# Patient Record
Sex: Female | Born: 1970 | Hispanic: Yes | Marital: Single | Smoking: Never smoker
Health system: Southern US, Community
[De-identification: ages and names within clinical notes are randomized; demographics above are authoritative.]

---

## 2004-03-05 ENCOUNTER — Emergency Department: Payer: Self-pay | Admitting: Emergency Medicine

## 2017-05-11 ENCOUNTER — Emergency Department: Payer: Self-pay

## 2017-05-11 ENCOUNTER — Other Ambulatory Visit: Payer: Self-pay

## 2017-05-11 ENCOUNTER — Emergency Department
Admission: EM | Admit: 2017-05-11 | Discharge: 2017-05-11 | Disposition: A | Discharge status: Home / Self Care | Payer: Self-pay | Attending: Emergency Medicine | Admitting: Emergency Medicine

## 2017-05-11 DIAGNOSIS — M5412 Radiculopathy, cervical region: Secondary | ICD-10-CM | POA: Insufficient documentation

## 2017-05-11 MED ORDER — DEXAMETHASONE SODIUM PHOSPHATE 10 MG/ML IJ SOLN: 20.0000 mg | Freq: Once | INTRAMUSCULAR | Status: DC

## 2017-05-11 MED ORDER — KETOROLAC TROMETHAMINE 30 MG/ML IJ SOLN
30.0000 mg | Freq: Once | INTRAMUSCULAR | Status: AC
  Administered 2017-05-11: 30 mg via INTRAMUSCULAR
  Filled 2017-05-11: qty 1

## 2017-05-11 MED ORDER — CYCLOBENZAPRINE HCL 10 MG PO TABS
10.0000 mg | ORAL_TABLET | Freq: Once | ORAL | Status: AC
  Administered 2017-05-11: 10 mg via ORAL
  Filled 2017-05-11: qty 1

## 2017-05-11 MED ORDER — PREDNISONE 50 MG PO TABS: ORAL_TABLET | ORAL | 0 refills | Status: DC

## 2017-05-11 MED ORDER — METHYLPREDNISOLONE SODIUM SUCC 125 MG IJ SOLR
125.0000 mg | Freq: Once | INTRAMUSCULAR | Status: AC
  Administered 2017-05-11: 125 mg via INTRAMUSCULAR
  Filled 2017-05-11: qty 2

## 2017-05-11 NOTE — ED Triage Notes (Signed)
Pt states upper back pain x 1 week. L upper back pain and upper middle per pt. Been taking tylenol at home. No cardiac hx for pt. No distress noted. Alert, oriented, ambulatory. Able to speak some english.

## 2017-05-11 NOTE — ED Provider Notes (Signed)
[facility] Emergency Department Provider Note  ____________________________________________  Time seen: Approximately 9:54 PM  I have reviewed the triage vital signs and the nursing notes.   HISTORY  Chief Complaint Back Pain    HPI Carolyn Moreno is a 47 y.o. female presents to the emergency department with upper back pain and numbness that radiates into the left upper extremity after patient performs extension at the neck.  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  Patient denies recent trauma or falls.  She denies chest pain, chest tightness, nausea, vomiting abdominal pain.   History reviewed. No pertinent past medical history.  There are no active problems to display for this patient.   Past Surgical History:  Procedure Laterality Date  . CESAREAN SECTION      Prior to Admission medications   Medication Sig Start Date End Date Taking? Authorizing Provider  predniSONE (DELTASONE) 50 MG tablet Take one tablet daily by mouth for the next five days. 05/11/17   Orvil Feil, PA-C    Allergies Patient has no known allergies.  History reviewed. No pertinent family history.  Social History Social History   Tobacco Use  . Smoking status: Never Smoker  Substance Use Topics  . Alcohol use: No    Frequency: Never  . Drug use: Not on file     Review of Systems  Constitutional: No fever/chills Eyes: No visual changes. No discharge ENT: No upper respiratory complaints. Cardiovascular: no chest pain. Respiratory: no cough. No SOB. Musculoskeletal: Patient has upper back pain and radiculopathy of the left upper extremity.  Skin: Negative for rash, abrasions, lacerations, ecchymosis. Neurological: Negative for headaches, focal weakness or numbness.   ____________________________________________   PHYSICAL EXAM:  VITAL SIGNS: ED Triage Vitals [05/11/17 2035]  Enc Vitals Group     BP      Pulse      Resp      Temp      Temp src      SpO2      Weight 182 lb (82.6 kg)     Height 5\' 4"  (1.626 m)     Head Circumference      Peak Flow      Pain Score 10     Pain Loc      Pain Edu?      Excl. in GC?      Constitutional: Alert and oriented. Well appearing and in no acute distress. Eyes: Conjunctivae are normal. PERRL. EOMI. Head: Atraumatic.  Cardiovascular: Normal rate, regular rhythm. Normal S1 and S2.  Good peripheral circulation. Respiratory: Normal respiratory effort without tachypnea or retractions. Lungs CTAB. Good air entry to the bases with no decreased or absent breath sounds. Musculoskeletal: Patient has radiculopathy elicited with range of motion testing at the neck.  No pain was elicited with paraspinal muscle palpation of the thoracic and cervical spine. Neurologic:  Normal speech and language. No gross focal neurologic deficits are appreciated.  Skin:  Skin is warm, dry and intact. No rash noted. Psychiatric: Mood and affect are normal. Speech and behavior are normal. Patient exhibits appropriate insight and judgement.   ____________________________________________   LABS (all labs ordered are listed, but only abnormal results are displayed)  Labs Reviewed - No data to display ____________________________________________  EKG   ____________________________________________  RADIOLOGY Geraldo Pitter, personally viewed and evaluated these images (plain radiographs) as part of my medical decision making, as well as reviewing the written report by  the radiologist.    Dg Cervical Spine 2-3 Views  Result Date: 05/11/2017 CLINICAL DATA:  Neck and upper back pain for 1 week.  No injury. EXAM: CERVICAL SPINE - 2-3 VIEW COMPARISON:  None. FINDINGS: There is no evidence of cervical spine fracture or prevertebral soft tissue swelling. Alignment is normal. No other significant bone abnormalities are identified. IMPRESSION: Normal alignment of the cervical vertebrae.  No acute displaced fractures identified. Electronically Signed   By: Burman NievesWilliam  Stevens M.D.   On: 05/11/2017 22:23   Dg Thoracic Spine 2 View  Result Date: 05/11/2017 CLINICAL DATA:  Neck and upper back pain. Pain for 1 week. No injury. EXAM: THORACIC SPINE 2 VIEWS COMPARISON:  None. FINDINGS: Normal alignment of the thoracic vertebrae. Degenerative changes with narrowed interspaces and endplate hypertrophic changes most prominent in the midthoracic region. No vertebral compression deformities. No focal bone lesion or bone destruction. No paraspinal soft tissue infiltration. IMPRESSION: Degenerative changes in the thoracic spine. Normal alignment. No acute displaced fractures identified Electronically Signed   By: Burman NievesWilliam  Stevens M.D.   On: 05/11/2017 22:22    ____________________________________________    PROCEDURES  Procedure(s) performed:    Procedures    Medications  cyclobenzaprine (FLEXERIL) tablet 10 mg (10 mg Oral Given 05/11/17 2144)  ketorolac (TORADOL) 30 MG/ML injection 30 mg (30 mg Intramuscular Given 05/11/17 2245)  methylPREDNISolone sodium succinate (SOLU-MEDROL) 125 mg/2 mL injection 125 mg (125 mg Intramuscular Given 05/11/17 2245)     ____________________________________________   INITIAL IMPRESSION / ASSESSMENT AND PLAN / ED COURSE  Pertinent labs & imaging results that were available during my care of the patient were reviewed by me and considered in my medical decision making (see chart for details).  Review of the [place] CSRS was performed in accordance of the [facility] prior to dispensing any controlled drugs.     Assessment and Plan: Back pain Patient presents to the emergency department with radiculopathy of the left upper extremity reproduced with range of motion at the neck.  Differential diagnosis included cervical radiculopathy, fracture and muscle spasm.  X-ray examination of the neck revealed no acute abnormality.  Patient was given an injection of  Solu-Medrol and discharged with prednisone.  Patient noted no improvement in her symptoms with Flexeril given in the emergency department.  All patient questions were answered.    ____________________________________________  FINAL CLINICAL IMPRESSION(S) / ED DIAGNOSES  Final diagnoses:  Cervical radiculopathy      NEW MEDICATIONS STARTED DURING THIS VISIT:  ED Discharge Orders        Ordered    predniSONE (DELTASONE) 50 MG tablet     05/11/17 2240          This chart was dictated using voice recognition software/Dragon. Despite best efforts to proofread, errors can occur which can change the meaning. Any change was purely unintentional.    Orvil FeilWoods, Charmion Hapke M, PA-C 05/11/17 2321    Minna AntisPaduchowski, Kevin, MD 05/12/17 2325

## 2020-08-30 ENCOUNTER — Other Ambulatory Visit: Payer: Self-pay

## 2020-08-30 ENCOUNTER — Emergency Department: Payer: Self-pay

## 2020-08-30 ENCOUNTER — Emergency Department
Admission: EM | Admit: 2020-08-30 | Discharge: 2020-08-30 | Disposition: A | Discharge status: Home / Self Care | Payer: Self-pay | Attending: Emergency Medicine | Admitting: Emergency Medicine

## 2020-08-30 ENCOUNTER — Encounter: Payer: Self-pay | Admitting: Emergency Medicine

## 2020-08-30 DIAGNOSIS — M25552 Pain in left hip: Secondary | ICD-10-CM | POA: Insufficient documentation

## 2020-08-30 DIAGNOSIS — G8929 Other chronic pain: Secondary | ICD-10-CM | POA: Insufficient documentation

## 2020-08-30 DIAGNOSIS — M79602 Pain in left arm: Secondary | ICD-10-CM | POA: Insufficient documentation

## 2020-08-30 LAB — CBC
HCT: 37.1 % (ref 36.0–46.0)
Hemoglobin: 12.7 g/dL (ref 12.0–15.0)
MCH: 30.5 pg (ref 26.0–34.0)
MCHC: 34.2 g/dL (ref 30.0–36.0)
MCV: 89.2 fL (ref 80.0–100.0)
Platelets: 271 10*3/uL (ref 150–400)
RBC: 4.16 MIL/uL (ref 3.87–5.11)
RDW: 14.7 % (ref 11.5–15.5)
WBC: 6.5 10*3/uL (ref 4.0–10.5)
nRBC: 0 % (ref 0.0–0.2)

## 2020-08-30 LAB — BASIC METABOLIC PANEL
Anion gap: 8 (ref 5–15)
BUN: 15 mg/dL (ref 6–20)
CO2: 23 mmol/L (ref 22–32)
Calcium: 9 mg/dL (ref 8.9–10.3)
Chloride: 109 mmol/L (ref 98–111)
Creatinine, Ser: 0.58 mg/dL (ref 0.44–1.00)
GFR, Estimated: >60 mL/min (ref >60)
Glucose, Bld: 137 mg/dL — ABNORMAL HIGH (ref 70–99)
Potassium: 3.7 mmol/L (ref 3.5–5.1)
Sodium: 140 mmol/L (ref 135–145)

## 2020-08-30 MED ORDER — PREDNISONE 10 MG (21) PO TBPK: ORAL_TABLET | ORAL | 0 refills | Status: DC

## 2020-08-30 NOTE — ED Provider Notes (Signed)
[facility] Emergency Department Provider Note  ____________________________________________   Event Date/Time   First MD Initiated Contact with Patient 08/30/20 1728     (approximate)  I have reviewed the triage vital signs and the nursing notes.   HISTORY  Chief Complaint Leg Pain    HPI Carolyn Moreno is a 50 y.o. female presents emergency department complaining of left leg and left arm pain for at least 3 months.  States she seen her PCP twice they gave her anti-inflammatories and muscle relaxers without any relief.  No new complaints as far as this is concerned.  Is the continued same complaint from 3 months ago; states her leg will go cold sometimes, pain without arm only with movement.    History reviewed. No pertinent past medical history.  There are no problems to display for this patient.   Past Surgical History:  Procedure Laterality Date  . CESAREAN SECTION      Prior to Admission medications   Medication Sig Start Date End Date Taking? Authorizing Provider  predniSONE (STERAPRED UNI-PAK 21 TAB) 10 MG (21) TBPK tablet Take 6 pills on day one then decrease by 1 pill each day 08/30/20  Yes Sherrie Mustache Roselyn Bering, PA-C    Allergies Patient has no known allergies.  History reviewed. No pertinent family history.  Social History Social History   Tobacco Use  . Smoking status: Never Smoker  . Smokeless tobacco: Never Used  Substance Use Topics  . Alcohol use: No    Review of Systems  Constitutional: No fever/chills Eyes: No visual changes. ENT: No sore throat. Respiratory: Denies cough Cardiovascular: Denies chest pain Gastrointestinal: Denies abdominal pain Genitourinary: Negative for dysuria. Musculoskeletal: Negative for back pain.  Positive leg pain and arm pain Skin: Negative for rash. Psychiatric: no mood changes,     ____________________________________________   PHYSICAL EXAM:  VITAL SIGNS: ED Triage Vitals   Enc Vitals Group     BP 08/30/20 1655 (!) 137/111     Pulse Rate 08/30/20 1655 100     Resp 08/30/20 1655 20     Temp 08/30/20 1655 98.4 F (36.9 C)     Temp Source 08/30/20 1655 Oral     SpO2 08/30/20 1655 100 %     Weight 08/30/20 1656 197 lb (89.4 kg)     Height 08/30/20 1656 5\' 4"  (1.626 m)     Head Circumference --      Peak Flow --      Pain Score 08/30/20 1656 8     Pain Loc --      Pain Edu? --      Excl. in GC? --     Constitutional: Alert and oriented. Well appearing and in no acute distress. Eyes: Conjunctivae are normal.  Head: Atraumatic. Nose: No congestion/rhinnorhea. Mouth/Throat: Mucous membranes are moist.   Neck:  supple no lymphadenopathy noted Cardiovascular: Normal rate, regular rhythm.  Respiratory: Normal respiratory effort.  No retractions, GU: deferred Musculoskeletal: FROM all extremities, warm and well perfused Neurologic:  Normal speech and language.  Skin:  Skin is warm, dry and intact. No rash noted. Psychiatric: Mood and affect are normal. Speech and behavior are normal.  ____________________________________________   LABS (all labs ordered are listed, but only abnormal results are displayed)  Labs Reviewed  BASIC METABOLIC PANEL - Abnormal; Notable for the following components:      Result Value   Glucose, Bld 137 (*)    All other components within normal limits  CBC  ____________________________________________   ____________________________________________  RADIOLOGY  Ultrasound left lower extremity  ____________________________________________   PROCEDURES  Procedure(s) performed: No  Procedures    ____________________________________________   INITIAL IMPRESSION / ASSESSMENT AND PLAN / ED COURSE  Pertinent labs & imaging results that were available during my care of the patient were reviewed by me and considered in my medical decision making (see chart for details).   Patient is 50 year old female presents  with chronic complaints of left leg and left arm pain.  See HPI.  Physical exam is unremarkable  DDx: DVT, radiculopathy cervical and lumbar, arthritis, inflammatory process  Labs are reassuring, CBC and metabolic panel are normal  Did explain the findings to the patient, encouraged her to follow-up with orthopedics.  Explained her this is a chronic problem not acute.  For the emergency department purposes, she does not have any acute findings that need to be further examined.  She was given a prescription for a steroid pack.  Discharged in stable condition.     Carolyn Moreno was evaluated in Emergency Department on 08/30/2020 for the symptoms described in the history of present illness. She was evaluated in the context of the global COVID-19 pandemic, which necessitated consideration that the patient might be at risk for infection with the SARS-CoV-2 virus that causes COVID-19. Institutional protocols and algorithms that pertain to the evaluation of patients at risk for COVID-19 are in a state of rapid change based on information released by regulatory bodies including the CDC and federal and state organizations. These policies and algorithms were followed during the patient's care in the ED.    As part of my medical decision making, I reviewed the following data within the electronic MEDICAL RECORD NUMBER Nursing notes reviewed and incorporated, Labs reviewed , Old chart reviewed, Radiograph reviewed , Notes from prior ED visits and [place] Controlled Substance Database  ____________________________________________   FINAL CLINICAL IMPRESSION(S) / ED DIAGNOSES  Final diagnoses:  Chronic hip pain, left  Left arm pain      NEW MEDICATIONS STARTED DURING THIS VISIT:  New Prescriptions   PREDNISONE (STERAPRED UNI-PAK 21 TAB) 10 MG (21) TBPK TABLET    Take 6 pills on day one then decrease by 1 pill each day     Note:  This document was prepared using Dragon voice recognition software and may  include unintentional dictation errors.    Faythe Ghee, PA-C 08/30/20 1818    Jene Every, MD 08/30/20 [phone]

## 2020-08-30 NOTE — ED Triage Notes (Signed)
Pt presents to the ED for L leg and L arm. Pt states that the past couple of week she has been having pain that started in her L groin that travelled down her leg. Pt states that she believe that it is "hot and burning inside." Pt also states that last night her L foot was swollen. Pt was seen recently by her PCP and they prescribed her pain med and anti inflammatory meds without relief.

## 2020-08-30 NOTE — ED Notes (Signed)
Lavender, light green and blue top sent to lab.   

## 2020-08-30 NOTE — Discharge Instructions (Addendum)
Follow-up with your regular doctor if not improving in 2 to 3 days.  Return emergency department if worsening.  Use medication as prescribed Call and make an appointment with orthopedics as this is a chronic problem and not acute.

## 2021-07-23 ENCOUNTER — Encounter: Payer: Self-pay | Admitting: Emergency Medicine

## 2021-07-23 ENCOUNTER — Observation Stay
Admission: EM | Admit: 2021-07-23 | Discharge: 2021-07-24 | Disposition: A | Discharge status: Home / Self Care | Payer: Self-pay | Attending: General Surgery | Admitting: General Surgery

## 2021-07-23 ENCOUNTER — Observation Stay: Payer: Self-pay | Admitting: Certified Registered"

## 2021-07-23 ENCOUNTER — Emergency Department: Payer: Self-pay

## 2021-07-23 ENCOUNTER — Encounter
Admission: EM | Disposition: A | Discharge status: Home / Self Care | Payer: Self-pay | Source: Home / Self Care | Attending: Student in an Organized Health Care Education/Training Program

## 2021-07-23 ENCOUNTER — Other Ambulatory Visit: Payer: Self-pay

## 2021-07-23 DIAGNOSIS — K3580 Unspecified acute appendicitis: Secondary | ICD-10-CM

## 2021-07-23 DIAGNOSIS — K353 Acute appendicitis with localized peritonitis, without perforation or gangrene: Principal | ICD-10-CM | POA: Insufficient documentation

## 2021-07-23 HISTORY — PX: XI ROBOTIC LAPAROSCOPIC ASSISTED APPENDECTOMY: SHX6877

## 2021-07-23 LAB — COMPREHENSIVE METABOLIC PANEL
ALT: 23 U/L (ref 0–44)
AST: 20 U/L (ref 15–41)
Albumin: 4 g/dL (ref 3.5–5.0)
Alkaline Phosphatase: 69 U/L (ref 38–126)
Anion gap: 9 (ref 5–15)
BUN: 15 mg/dL (ref 6–20)
CO2: 26 mmol/L (ref 22–32)
Calcium: 9.2 mg/dL (ref 8.9–10.3)
Chloride: 104 mmol/L (ref 98–111)
Creatinine, Ser: 0.6 mg/dL (ref 0.44–1.00)
GFR, Estimated: >60 mL/min (ref >60)
Glucose, Bld: 104 mg/dL — ABNORMAL HIGH (ref 70–99)
Potassium: 3.6 mmol/L (ref 3.5–5.1)
Sodium: 139 mmol/L (ref 135–145)
Total Bilirubin: 0.7 mg/dL (ref 0.3–1.2)
Total Protein: 7.6 g/dL (ref 6.5–8.1)

## 2021-07-23 LAB — URINALYSIS, ROUTINE W REFLEX MICROSCOPIC
Bilirubin Urine: NEGATIVE
Glucose, UA: NEGATIVE mg/dL
Hgb urine dipstick: NEGATIVE
Ketones, ur: NEGATIVE mg/dL
Leukocytes,Ua: NEGATIVE
Nitrite: NEGATIVE
Protein, ur: NEGATIVE mg/dL
Specific Gravity, Urine: 1.006 (ref 1.005–1.030)
pH: 7 (ref 5.0–8.0)

## 2021-07-23 LAB — CBC
HCT: 38.6 % (ref 36.0–46.0)
Hemoglobin: 12.6 g/dL (ref 12.0–15.0)
MCH: 29.4 pg (ref 26.0–34.0)
MCHC: 32.6 g/dL (ref 30.0–36.0)
MCV: 90 fL (ref 80.0–100.0)
Platelets: 304 10*3/uL (ref 150–400)
RBC: 4.29 MIL/uL (ref 3.87–5.11)
RDW: 14.3 % (ref 11.5–15.5)
WBC: 10.9 10*3/uL — ABNORMAL HIGH (ref 4.0–10.5)
nRBC: 0 % (ref 0.0–0.2)

## 2021-07-23 LAB — POC URINE PREG, ED: Preg Test, Ur: NEGATIVE

## 2021-07-23 LAB — TROPONIN I (HIGH SENSITIVITY): Troponin I (High Sensitivity): 4 ng/L (ref <18)

## 2021-07-23 LAB — LIPASE, BLOOD: Lipase: 24 U/L (ref 11–51)

## 2021-07-23 SURGERY — APPENDECTOMY, ROBOT-ASSISTED, LAPAROSCOPIC
Anesthesia: General

## 2021-07-23 MED ORDER — MORPHINE SULFATE (PF) 4 MG/ML IV SOLN
4.0000 mg | INTRAVENOUS | Status: DC | PRN
  Administered 2021-07-23: 4 mg via INTRAVENOUS
  Filled 2021-07-23: qty 1

## 2021-07-23 MED ORDER — EPHEDRINE SULFATE (PRESSORS) 50 MG/ML IJ SOLN
INTRAMUSCULAR | Status: DC | PRN
  Administered 2021-07-23: 10 mg via INTRAVENOUS
  Administered 2021-07-23: 5 mg via INTRAVENOUS

## 2021-07-23 MED ORDER — SODIUM CHLORIDE 0.9 % IV BOLUS
500.0000 mL | Freq: Once | INTRAVENOUS | Status: AC
  Administered 2021-07-23: 500 mL via INTRAVENOUS

## 2021-07-23 MED ORDER — LIDOCAINE VISCOUS HCL 2 % MT SOLN
15.0000 mL | Freq: Once | OROMUCOSAL | Status: AC
  Administered 2021-07-23: 15 mL via ORAL
  Filled 2021-07-23: qty 15

## 2021-07-23 MED ORDER — HYDROCODONE-ACETAMINOPHEN 5-325 MG PO TABS
1.0000 | ORAL_TABLET | ORAL | Status: DC | PRN
  Administered 2021-07-24 (×2): 2 via ORAL
  Filled 2021-07-23 (×2): qty 2

## 2021-07-23 MED ORDER — IOHEXOL 300 MG/ML  SOLN
100.0000 mL | Freq: Once | INTRAMUSCULAR | Status: AC | PRN
Start: 2021-07-23 — End: 2021-07-23
  Administered 2021-07-23: 100 mL via INTRAVENOUS

## 2021-07-23 MED ORDER — PIPERACILLIN-TAZOBACTAM 3.375 G IVPB
3.3750 g | Freq: Three times a day (TID) | INTRAVENOUS | Status: DC
  Administered 2021-07-24: 3.375 g via INTRAVENOUS
  Filled 2021-07-23: qty 50

## 2021-07-23 MED ORDER — BUPIVACAINE-EPINEPHRINE (PF) 0.5% -1:200000 IJ SOLN
INTRAMUSCULAR | Status: AC
  Filled 2021-07-23: qty 30

## 2021-07-23 MED ORDER — PIPERACILLIN-TAZOBACTAM 3.375 G IVPB 30 MIN
3.3750 g | Freq: Once | INTRAVENOUS | Status: AC
  Administered 2021-07-23: 3.375 g via INTRAVENOUS
  Filled 2021-07-23: qty 50

## 2021-07-23 MED ORDER — ONDANSETRON 4 MG PO TBDP: 4.0000 mg | ORAL_TABLET | Freq: Four times a day (QID) | ORAL | Status: DC | PRN

## 2021-07-23 MED ORDER — LIDOCAINE HCL (PF) 2 % IJ SOLN
INTRAMUSCULAR | Status: AC
  Filled 2021-07-23: qty 5

## 2021-07-23 MED ORDER — ENOXAPARIN SODIUM 40 MG/0.4ML IJ SOSY
40.0000 mg | PREFILLED_SYRINGE | INTRAMUSCULAR | Status: DC
  Administered 2021-07-23: 40 mg via SUBCUTANEOUS
  Filled 2021-07-23: qty 0.4

## 2021-07-23 MED ORDER — PHENYLEPHRINE 40 MCG/ML (10ML) SYRINGE FOR IV PUSH (FOR BLOOD PRESSURE SUPPORT)
PREFILLED_SYRINGE | INTRAVENOUS | Status: AC
Start: 2021-07-23 — End: ?
  Filled 2021-07-23: qty 10

## 2021-07-23 MED ORDER — DEXAMETHASONE SODIUM PHOSPHATE 10 MG/ML IJ SOLN
INTRAMUSCULAR | Status: DC | PRN
  Administered 2021-07-23: 10 mg via INTRAVENOUS

## 2021-07-23 MED ORDER — ONDANSETRON HCL 4 MG/2ML IJ SOLN
INTRAMUSCULAR | Status: AC
  Filled 2021-07-23: qty 2

## 2021-07-23 MED ORDER — ACETAMINOPHEN 325 MG PO TABS: 650.0000 mg | ORAL_TABLET | Freq: Four times a day (QID) | ORAL | Status: DC | PRN

## 2021-07-23 MED ORDER — PHENYLEPHRINE HCL (PRESSORS) 10 MG/ML IV SOLN
INTRAVENOUS | Status: DC | PRN
  Administered 2021-07-23: 80 ug via INTRAVENOUS
  Administered 2021-07-23 (×2): 40 ug via INTRAVENOUS

## 2021-07-23 MED ORDER — DEXMEDETOMIDINE HCL IN NACL 80 MCG/20ML IV SOLN
INTRAVENOUS | Status: AC
Start: 2021-07-23 — End: ?
  Filled 2021-07-23: qty 20

## 2021-07-23 MED ORDER — 0.9 % SODIUM CHLORIDE (POUR BTL) OPTIME
TOPICAL | Status: DC | PRN
  Administered 2021-07-23: 500 mL

## 2021-07-23 MED ORDER — EPHEDRINE 5 MG/ML INJ
INTRAVENOUS | Status: AC
  Filled 2021-07-23: qty 5

## 2021-07-23 MED ORDER — PROPOFOL 10 MG/ML IV BOLUS
INTRAVENOUS | Status: DC | PRN
  Administered 2021-07-23: 150 mg via INTRAVENOUS

## 2021-07-23 MED ORDER — SODIUM CHLORIDE 0.9 % IV SOLN: Freq: Once | INTRAVENOUS | Status: DC

## 2021-07-23 MED ORDER — LACTATED RINGERS IV SOLN: INTRAVENOUS | Status: DC | PRN

## 2021-07-23 MED ORDER — DEXMEDETOMIDINE (PRECEDEX) IN NS 20 MCG/5ML (4 MCG/ML) IV SYRINGE
PREFILLED_SYRINGE | INTRAVENOUS | Status: DC | PRN
  Administered 2021-07-23: 4 ug via INTRAVENOUS

## 2021-07-23 MED ORDER — FENTANYL CITRATE (PF) 100 MCG/2ML IJ SOLN
INTRAMUSCULAR | Status: AC
  Filled 2021-07-23: qty 2

## 2021-07-23 MED ORDER — ROCURONIUM BROMIDE 100 MG/10ML IV SOLN
INTRAVENOUS | Status: DC | PRN
Start: 2021-07-23 — End: 2021-07-23
  Administered 2021-07-23: 30 mg via INTRAVENOUS

## 2021-07-23 MED ORDER — SEVOFLURANE IN SOLN
RESPIRATORY_TRACT | Status: AC
  Filled 2021-07-23: qty 250

## 2021-07-23 MED ORDER — ONDANSETRON HCL 4 MG/2ML IJ SOLN: 4.0000 mg | Freq: Four times a day (QID) | INTRAMUSCULAR | Status: DC | PRN

## 2021-07-23 MED ORDER — PROPOFOL 10 MG/ML IV BOLUS
INTRAVENOUS | Status: AC
  Filled 2021-07-23: qty 20

## 2021-07-23 MED ORDER — ALUM & MAG HYDROXIDE-SIMETH 200-200-20 MG/5ML PO SUSP
30.0000 mL | Freq: Once | ORAL | Status: AC
  Administered 2021-07-23: 30 mL via ORAL
  Filled 2021-07-23: qty 30

## 2021-07-23 MED ORDER — FENTANYL CITRATE (PF) 100 MCG/2ML IJ SOLN
INTRAMUSCULAR | Status: DC | PRN
  Administered 2021-07-23: 50 ug via INTRAVENOUS
  Administered 2021-07-23: 100 ug via INTRAVENOUS

## 2021-07-23 MED ORDER — ACETAMINOPHEN 10 MG/ML IV SOLN
INTRAVENOUS | Status: DC | PRN
  Administered 2021-07-23: 1000 mg via INTRAVENOUS

## 2021-07-23 MED ORDER — SUGAMMADEX SODIUM 200 MG/2ML IV SOLN
INTRAVENOUS | Status: DC | PRN
  Administered 2021-07-23: 200 mg via INTRAVENOUS

## 2021-07-23 MED ORDER — DEXAMETHASONE SODIUM PHOSPHATE 10 MG/ML IJ SOLN
INTRAMUSCULAR | Status: AC
  Filled 2021-07-23: qty 1

## 2021-07-23 MED ORDER — KETOROLAC TROMETHAMINE 30 MG/ML IJ SOLN
INTRAMUSCULAR | Status: AC
  Filled 2021-07-23: qty 1

## 2021-07-23 MED ORDER — ONDANSETRON 4 MG PO TBDP
4.0000 mg | ORAL_TABLET | Freq: Once | ORAL | Status: AC | PRN
  Administered 2021-07-23: 4 mg via ORAL
  Filled 2021-07-23: qty 1

## 2021-07-23 MED ORDER — FENTANYL CITRATE (PF) 100 MCG/2ML IJ SOLN
INTRAMUSCULAR | Status: AC
Start: 2021-07-23 — End: ?
  Filled 2021-07-23: qty 2

## 2021-07-23 MED ORDER — SUCCINYLCHOLINE CHLORIDE 200 MG/10ML IV SOSY
PREFILLED_SYRINGE | INTRAVENOUS | Status: DC | PRN
  Administered 2021-07-23: 100 mg via INTRAVENOUS

## 2021-07-23 MED ORDER — ONDANSETRON HCL 4 MG/2ML IJ SOLN: 4.0000 mg | Freq: Once | INTRAMUSCULAR | Status: DC | PRN

## 2021-07-23 MED ORDER — SUCCINYLCHOLINE CHLORIDE 200 MG/10ML IV SOSY
PREFILLED_SYRINGE | INTRAVENOUS | Status: AC
  Filled 2021-07-23: qty 10

## 2021-07-23 MED ORDER — MIDAZOLAM HCL 2 MG/2ML IJ SOLN
INTRAMUSCULAR | Status: DC | PRN
  Administered 2021-07-23: 2 mg via INTRAVENOUS

## 2021-07-23 MED ORDER — ROCURONIUM BROMIDE 10 MG/ML (PF) SYRINGE
PREFILLED_SYRINGE | INTRAVENOUS | Status: AC
  Filled 2021-07-23: qty 10

## 2021-07-23 MED ORDER — OXYCODONE HCL 5 MG PO TABS
5.0000 mg | ORAL_TABLET | Freq: Once | ORAL | Status: AC | PRN
  Administered 2021-07-23: 5 mg via ORAL

## 2021-07-23 MED ORDER — ACETAMINOPHEN 650 MG RE SUPP: 650.0000 mg | Freq: Four times a day (QID) | RECTAL | Status: DC | PRN

## 2021-07-23 MED ORDER — BUPIVACAINE-EPINEPHRINE (PF) 0.5% -1:200000 IJ SOLN
INTRAMUSCULAR | Status: DC | PRN
  Administered 2021-07-23: 30 mL via PERINEURAL

## 2021-07-23 MED ORDER — SODIUM CHLORIDE 0.9 % IV SOLN: INTRAVENOUS | Status: DC

## 2021-07-23 MED ORDER — FENTANYL CITRATE (PF) 100 MCG/2ML IJ SOLN
25.0000 ug | INTRAMUSCULAR | Status: DC | PRN
  Administered 2021-07-23 (×4): 25 ug via INTRAVENOUS

## 2021-07-23 MED ORDER — OXYCODONE HCL 5 MG/5ML PO SOLN: 5.0000 mg | Freq: Once | ORAL | Status: AC | PRN

## 2021-07-23 MED ORDER — MIDAZOLAM HCL 2 MG/2ML IJ SOLN
INTRAMUSCULAR | Status: AC
  Filled 2021-07-23: qty 2

## 2021-07-23 MED ORDER — KETOROLAC TROMETHAMINE 30 MG/ML IJ SOLN
INTRAMUSCULAR | Status: DC | PRN
  Administered 2021-07-23: 30 mg via INTRAVENOUS

## 2021-07-23 MED ORDER — ONDANSETRON HCL 4 MG/2ML IJ SOLN
INTRAMUSCULAR | Status: DC | PRN
  Administered 2021-07-23: 4 mg via INTRAVENOUS

## 2021-07-23 MED ORDER — LIDOCAINE HCL (CARDIAC) PF 100 MG/5ML IV SOSY
PREFILLED_SYRINGE | INTRAVENOUS | Status: DC | PRN
Start: 2021-07-23 — End: 2021-07-23
  Administered 2021-07-23: 60 mg via INTRAVENOUS

## 2021-07-23 MED ORDER — OXYCODONE HCL 5 MG PO TABS
ORAL_TABLET | ORAL | Status: AC
  Filled 2021-07-23: qty 1

## 2021-07-23 SURGICAL SUPPLY — 55 items
BLADE SURG SZ11 CARB STEEL (BLADE) ×2 IMPLANT
CANNULA REDUC XI 12-8 STAPL (CANNULA) ×1
CANNULA REDUCER 12-8 DVNC XI (CANNULA) ×1 IMPLANT
COVER TIP SHEARS 8 DVNC (MISCELLANEOUS) ×1 IMPLANT
COVER TIP SHEARS 8MM DA VINCI (MISCELLANEOUS) ×1
DERMABOND ADVANCED (GAUZE/BANDAGES/DRESSINGS) ×1
DERMABOND ADVANCED .7 DNX12 (GAUZE/BANDAGES/DRESSINGS) ×1 IMPLANT
DRAPE ARM DVNC X/XI (DISPOSABLE) ×4 IMPLANT
DRAPE COLUMN DVNC XI (DISPOSABLE) ×1 IMPLANT
DRAPE DA VINCI XI ARM (DISPOSABLE) ×4
DRAPE DA VINCI XI COLUMN (DISPOSABLE) ×1
ELECT REM PT RETURN 9FT ADLT (ELECTROSURGICAL) ×2
ELECTRODE REM PT RTRN 9FT ADLT (ELECTROSURGICAL) ×1 IMPLANT
GLOVE SURG SYN 6.5 ES PF (GLOVE) ×4 IMPLANT
GLOVE SURG SYN 6.5 PF PI (GLOVE) ×2 IMPLANT
GLOVE SURG UNDER POLY LF SZ6.5 (GLOVE) ×4 IMPLANT
GOWN STRL REUS W/ TWL LRG LVL3 (GOWN DISPOSABLE) ×3 IMPLANT
GOWN STRL REUS W/TWL LRG LVL3 (GOWN DISPOSABLE) ×3
GRASPER SUT TROCAR 14GX15 (MISCELLANEOUS) ×1 IMPLANT
KIT PINK PAD W/HEAD ARE REST (MISCELLANEOUS) ×2 IMPLANT
KIT PINK PAD W/HEAD ARM REST (MISCELLANEOUS) ×1 IMPLANT
MANIFOLD NEPTUNE II (INSTRUMENTS) ×2 IMPLANT
NDL INSUFFLATION 14GA 120MM (NEEDLE) ×1 IMPLANT
NEEDLE HYPO 22GX1.5 SAFETY (NEEDLE) ×2 IMPLANT
NEEDLE INSUFFLATION 14GA 120MM (NEEDLE) ×2 IMPLANT
OBTURATOR OPTICAL STANDARD 8MM (TROCAR) ×1
OBTURATOR OPTICAL STND 8 DVNC (TROCAR) ×1
OBTURATOR OPTICALSTD 8 DVNC (TROCAR) ×1 IMPLANT
PACK LAP CHOLECYSTECTOMY (MISCELLANEOUS) ×2 IMPLANT
RELOAD STAPLE 45 2.5 WHT DVNC (STAPLE) IMPLANT
RELOAD STAPLE 45 3.5 BLU DVNC (STAPLE) ×1 IMPLANT
RELOAD STAPLER 2.5X45 WHT DVNC (STAPLE) IMPLANT
RELOAD STAPLER 3.5X45 BLU DVNC (STAPLE) ×1 IMPLANT
SEAL CANN UNIV 5-8 DVNC XI (MISCELLANEOUS) ×3 IMPLANT
SEAL XI 5MM-8MM UNIVERSAL (MISCELLANEOUS) ×3
SET TUBE SMOKE EVAC HIGH FLOW (TUBING) ×2 IMPLANT
SOLUTION ELECTROLUBE (MISCELLANEOUS) ×2 IMPLANT
SPONGE T-LAP 4X18 ~~LOC~~+RFID (SPONGE) ×2 IMPLANT
STAPLER 45 DA VINCI SURE FORM (STAPLE) ×1
STAPLER 45 SUREFORM DVNC (STAPLE) ×1 IMPLANT
STAPLER CANNULA SEAL DVNC XI (STAPLE) ×1 IMPLANT
STAPLER CANNULA SEAL XI (STAPLE) ×1
STAPLER RELOAD 2.5X45 WHITE (STAPLE)
STAPLER RELOAD 2.5X45 WHT DVNC (STAPLE)
STAPLER RELOAD 3.5X45 BLU DVNC (STAPLE) ×1
STAPLER RELOAD 3.5X45 BLUE (STAPLE) ×1
SUT MNCRL AB 4-0 PS2 18 (SUTURE) ×2 IMPLANT
SUT VIC AB 2-0 SH 27 (SUTURE) ×1
SUT VIC AB 2-0 SH 27XBRD (SUTURE) ×1 IMPLANT
SUT VICRYL 0 AB UR-6 (SUTURE) ×2 IMPLANT
SUT VLOC 90 6 CV-15 VIOLET (SUTURE) ×2 IMPLANT
SYR 30ML LL (SYRINGE) ×2 IMPLANT
SYS BAG RETRIEVAL 10MM (BASKET) ×2
SYSTEM BAG RETRIEVAL 10MM (BASKET) ×1 IMPLANT
WATER STERILE IRR 500ML POUR (IV SOLUTION) ×2 IMPLANT

## 2021-07-23 NOTE — H&P (Signed)
SURGICAL CONSULTATION NOTE  ? ?HISTORY OF PRESENT ILLNESS (HPI):  ?51 y.o. female presented to [facility] ED for evaluation of abdominal pain since 2 days ago. Patient reports patient reported the pain started in the mid abdomen and left abdomen but now is localized to the right lower quadrant.  Pain is aggravated by applying pressure.  There has been no alleviating factors.  Patient tried Tylenol and ibuprofen for pain without relief.  She denies any nausea or vomiting.  She denies any fever. ? ?At the ED she was found with right lower quadrant tenderness.  Labs shows mild leukocytosis.  CT scan of the abdomen and pelvis shows acute appendicitis without perforation.  I personally evaluated the images. ? ?Surgery is consulted by Dr. Roxan Hockey in this context for evaluation and management of acute cholecystitis. ? ?PAST MEDICAL HISTORY (PMH):  ?History reviewed. No pertinent past medical history.  ? ?PAST SURGICAL HISTORY (PSH):  ?Past Surgical History:  ?Procedure Laterality Date  ? CESAREAN SECTION    ?  ? ?MEDICATIONS:  ?Prior to Admission medications   ?Not on File  ?  ? ?ALLERGIES:  ?No Known Allergies  ? ?SOCIAL HISTORY:  ?Social History  ? ?Socioeconomic History  ? Marital status: Single  ?  Spouse name: Not on file  ? Number of children: Not on file  ? Years of education: Not on file  ? Highest education level: Not on file  ?Occupational History  ? Not on file  ?Tobacco Use  ? Smoking status: Never  ? Smokeless tobacco: Never  ?Vaping Use  ? Vaping Use: Never used  ?Substance and Sexual Activity  ? Alcohol use: No  ? Drug use: Never  ? Sexual activity: Not on file  ?Other Topics Concern  ? Not on file  ?Social History Narrative  ? Not on file  ? ?Social Determinants of Health  ? ?Financial Resource Strain: Not on file  ?Food Insecurity: Not on file  ?Transportation Needs: Not on file  ?Physical Activity: Not on file  ?Stress: Not on file  ?Social Connections: Not on file  ?Intimate Partner Violence: Not on file  ?   ? ? ?FAMILY HISTORY:  ?No family history on file.  ? ?REVIEW OF SYSTEMS:  ?Constitutional: denies weight loss, fever, chills, or sweats  ?Eyes: denies any other vision changes, history of eye injury  ?ENT: denies sore throat, hearing problems  ?Respiratory: denies shortness of breath, wheezing  ?Cardiovascular: denies chest pain, palpitations  ?Gastrointestinal: positive abdominal pain, negative nausea and vomiting ?Genitourinary: denies burning with urination or urinary frequency ?Musculoskeletal: denies any other joint pains or cramps  ?Skin: denies any other rashes or skin discolorations  ?Neurological: denies any other headache, dizziness, weakness  ?Psychiatric: denies any other depression, anxiety  ? ?All other review of systems were negative  ? ?VITAL SIGNS:  ?Temp:  [99.4 ?F (37.4 ?C)] 99.4 ?F (37.4 ?C) (04/07 1525) ?Pulse Rate:  [84-95] 85 (04/07 1800) ?Resp:  [16-18] 16 (04/07 1800) ?BP: (137-164)/(78-95) 141/81 (04/07 1800) ?SpO2:  [96 %-97 %] 97 % (04/07 1800) ?Weight:  [85.7 kg] 85.7 kg (04/07 1531)     Height: 5\' 3"  (160 cm) Weight: 85.7 kg BMI (Calculated): 33.49  ? ?INTAKE/OUTPUT:  ?This shift: No intake/output data recorded.  ?Last 2 shifts: @IOLAST2SHIFTS @  ? ?PHYSICAL EXAM:  ?Constitutional:  ?-- Normal body habitus  ?-- Awake, alert, and oriented x3  ?Eyes:  ?-- Pupils equally round and reactive to light  ?-- No scleral icterus  ?Ear, nose,  and throat:  ?-- No jugular venous distension  ?Pulmonary:  ?-- No crackles  ?-- Equal breath sounds bilaterally ?-- Breathing non-labored at rest ?Cardiovascular:  ?-- S1, S2 present  ?-- No pericardial rubs ?Gastrointestinal:  ?-- Abdomen soft, tender in the right upper quadrant, non-distended, no guarding or rebound tenderness ?-- No abdominal masses appreciated, pulsatile or otherwise  ?Musculoskeletal and Integumentary:  ?-- Wounds or skin discoloration: None appreciated ?-- Extremities: B/L UE and LE FROM, hands and feet warm, no edema  ?Neurologic:   ?-- Motor function: intact and symmetric ?-- Sensation: intact and symmetric ? ? ?Labs:  ? ?  Latest Ref Rng & Units 07/23/2021  ?  3:26 PM 08/30/2020  ?  4:57 PM  ?CBC  ?WBC 4.0 - 10.5 K/uL 10.9   6.5    ?Hemoglobin 12.0 - 15.0 g/dL 48.0   16.5    ?Hematocrit 36.0 - 46.0 % 38.6   37.1    ?Platelets 150 - 400 K/uL 304   271    ? ? ?  Latest Ref Rng & Units 07/23/2021  ?  3:26 PM 08/30/2020  ?  4:57 PM  ?CMP  ?Glucose 70 - 99 mg/dL 537   482    ?BUN 6 - 20 mg/dL 15   15    ?Creatinine 0.44 - 1.00 mg/dL 7.07   8.67    ?Sodium 135 - 145 mmol/L 139   140    ?Potassium 3.5 - 5.1 mmol/L 3.6   3.7    ?Chloride 98 - 111 mmol/L 104   109    ?CO2 22 - 32 mmol/L 26   23    ?Calcium 8.9 - 10.3 mg/dL 9.2   9.0    ?Total Protein 6.5 - 8.1 g/dL 7.6     ?Total Bilirubin 0.3 - 1.2 mg/dL 0.7     ?Alkaline Phos 38 - 126 U/L 69     ?AST 15 - 41 U/L 20     ?ALT 0 - 44 U/L 23     ? ? ?Imaging studies:  ?EXAM: ?CT ABDOMEN AND PELVIS WITH CONTRAST ?  ?TECHNIQUE: ?Multidetector CT imaging of the abdomen and pelvis was performed ?using the standard protocol following bolus administration of ?intravenous contrast. ?  ?RADIATION DOSE REDUCTION: This exam was performed according to the ?departmental dose-optimization program which includes automated ?exposure control, adjustment of the mA and/or kV according to ?patient size and/or use of iterative reconstruction technique. ?  ?CONTRAST:  OMNIPAQUE IOHEXOL 300 MG/ML  SOLN ?  ?COMPARISON:  None. ?  ?FINDINGS: ?Lower chest: The lung bases are clear without focal nodule, mass, or ?airspace disease. Heart size is normal. No significant pleural or ?pericardial effusion is present. ?  ?Hepatobiliary: No focal liver abnormality is seen. No gallstones, ?gallbladder wall thickening, or biliary dilatation. ?  ?Pancreas: Unremarkable. No pancreatic ductal dilatation or ?surrounding inflammatory changes. ?  ?Spleen: Normal in size without focal abnormality. ?  ?Adrenals/Urinary Tract: Adrenal glands are  within normal limits ?bilaterally. Kidneys and ureters are unremarkable. Urinary bladder ?is within normal limits. ?  ?Stomach/Bowel: Stomach and duodenum are within normal limits. Small ?bowel is unremarkable. Terminal ileum is within normal limits. ?Appendicolith no is present. The appendix is dilated up to 8 mm a ?mild stranding. The ascending and transverse colon are normal. ?Transverse and descending colon are unremarkable. ?  ?Vascular/Lymphatic: No significant vascular findings are present. No ?enlarged abdominal or pelvic lymph nodes. ?  ?Reproductive: Nabothian cysts are present. Mild edema is  present in ?the uterus. Adnexa are unremarkable. ?  ?Other: No abdominal wall hernia or abnormality. No abdominopelvic ?ascites. ?  ?Musculoskeletal: Vacuum disc present at L5-S1 with central and ?foraminal narrowing. Vertebral body heights are maintained. No focal ?osseous lesions are present. Bony pelvis is within normal limits. ?Hips are unremarkable. ?  ?IMPRESSION: ?1. Acute appendicitis without evidence for rupture or abscess. ?2. Mild edema in the uterus is likely physiologic. ?3. Vacuum disc at L5-S1 with central and foraminal narrowing. ?  ?  ?Electronically Signed ?  By: Marin Robertshristopher  Mattern M.D. ?  On: 07/23/2021 19:11 ? ?Assessment/Plan:  ?51 y.o. female with acute appendicitis with localized peritonitis. ? ?Patient with history, physical exam and images consistent with acute appendicitis. Patient oriented about diagnosis and surgical management as treatment. Patient oriented about goals of surgery and its risk including: bowel injury, infection, abscess, bleeding, leak from cecum, intestinal adhesions, bowel obstruction, fistula, injury to the ureter among others.  ?Patient understood and agreed to proceed with surgery. Will admit patient, already started on antibiotic therapy, will give IV hydration since patient is NPO and schedule to OR.  ? ?Wilmon ArmsEdgardo Cintr?n-D?az, MD ? ? ?

## 2021-07-23 NOTE — ED Provider Triage Note (Signed)
Emergency Medicine Provider Triage Evaluation Note ? ?Carolyn Moreno , a 51 y.o. female  was evaluated in triage.  Pt complains of left upper quadrant abdominal pain with nausea.  Patient presents with 2 to 3-day history of left upper quadrant pain.  No emesis, diarrhea or constipation.  No urinary changes.  No fevers or chills.. ? ?Review of Systems  ?Positive: Left upper quadrant abdominal pain, nausea ?Negative: Fevers, chills, emesis, diarrhea, constipation, urinary changes ? ?Physical Exam  ?BP (!) 164/95   Pulse 95   Temp 99.4 ?F (37.4 ?C) (Oral)   Resp 18   Ht 5\' 3"  (1.6 m)   Wt 85.7 kg   SpO2 96%   BMI 33.48 kg/m?  ?Gen:   Awake, no distress   ?Resp:  Normal effort  ?MSK:   Moves extremities without difficulty  ?Other:  For slight tenderness in the left upper quadrant but no other tenderness.  No guarding or rigidity. ? ?Medical Decision Making  ?Medically screening exam initiated at 3:34 PM.  Appropriate orders placed.  Erika Cassis was informed that the remainder of the evaluation will be completed by another provider, this initial triage assessment does not replace that evaluation, and the importance of remaining in the ED until their evaluation is complete. ? ?Patient with left upper quadrant abdominal pain.  We will start with labs at this time. ?  ? , PA-C ?07/23/21 1535 ? ?

## 2021-07-23 NOTE — Transfer of Care (Signed)
Immediate Anesthesia Transfer of Care Note ? ?Patient: Carolyn Moreno ? ?Procedure(s) Performed: XI ROBOTIC LAPAROSCOPIC ASSISTED APPENDECTOMY ? ?Patient Location: PACU ? ?Anesthesia Type:General ? ?Level of Consciousness: awake ? ?Airway & Oxygen Therapy: Patient Spontanous Breathing ? ?Post-op Assessment: Report given to RN ? ?Post vital signs: stable ? ?Last Vitals:  ?Vitals Value Taken Time  ?BP 114/62 07/23/21 2130  ?Temp 36.6 ?C 07/23/21 2130  ?Pulse 76 07/23/21 2135  ?Resp 16 07/23/21 2135  ?SpO2 94 % 07/23/21 2135  ?Vitals shown include unvalidated device data. ? ?Last Pain:  ?Vitals:  ? 07/23/21 2130  ?TempSrc:   ?PainSc: Asleep  ?   ? ?  ? ?Complications: No notable events documented. ?

## 2021-07-23 NOTE — ED Triage Notes (Signed)
Pt in via POV, reports LUQ abdominal pain x 2 days, some nausea, denies V/D.  Ambulatory to triage, NAD noted at this time.   ?

## 2021-07-23 NOTE — Op Note (Signed)
Pre-op Diagnosis: Acute appendicitis  ? ?Post op Diagnosis: Acute appenditicis ? ?Procedure: Robotic assisted laparoscopic appendectomy. ? ?Anesthesia: GETA ? ?Surgeon: Carolan Shiver, MD, FACS ? ?Wound Classification: clean contaminated ? ?Specimen: Appendix ? ?Complications: None ? ?Estimated Blood Loss: 3 mL ? ? ?Indications: Patient is a 51 y.o. female  presented with above right lower quadrant pain. CT scan shows acute appendicitis.    ? ?FIndings: ?1.  Suppurative appendix with appendicolith ?2. No peri-appendiceal abscess or phlegmon ?3. Normal anatomy ?4. Adequate hemostasis.  ? ? ? ? ?Description of procedure: The patient was placed on the operating table in the supine position. General anesthesia was induced. A time-out was completed verifying correct patient, procedure, site, positioning, and implant(s) and/or special equipment prior to beginning this procedure. The abdomen was prepped and draped in the usual sterile fashion.  ? ?Palmer's point located and Veress needle was inserted.  After confirming 2 clicks and a positive saline drop test, gas insufflation was initiated until the abdominal pressure was measured at 15 mmHg.  Afterwards, the Veress needle was removed and a 8 mm port was placed in left upper quadrant area using Optiview technique.  After local was infused, 3 additional incision on the left abdominal wall were made 5 cm apart.  An 12 mm port and two other 8 mm ports were placed under direct visualization.  No injuries from trocar placements were noted.  The table was placed in the Trendelenburg position with the right side elevated.  With the use of Tip up grasper, Fenestrated Bipolar and Vessel sealer, an inflamed appendix was identified and elevated.  Window created at base of appendix in the mesentery.   ? ?The mesoappendix was divided with bipolar energy and scissors. The base of the appendix was ligated with 3-0 Vloc purse string. The appendix was divided and a second purse  string done to imbricate the appendix stump.  ? ?The appendiceal stump was examined and hemostasis noted. No other pathology was identified within pelvis. The 12 mm trocar removed and port site closed with PMI using 0 vicryl under direct vision. Remaining trocars were removed under direct vision. No bleeding was noted.The abdomen was allowed to collapse.  All skin incisions then closed with subcuticular sutures Monocryl 4-0.  Wounds then dressed with dermabond. ? ?The patient tolerated the procedure well, awakened from anesthesia and was taken to the postanesthesia care unit in satisfactory condition.  Sponge count and instrument count correct at the end of the procedure. ? ?

## 2021-07-23 NOTE — Anesthesia Procedure Notes (Signed)
Procedure Name: Intubation ?Date/Time: 07/23/2021 8:34 PM ?Performed by: Garner Nash, CRNA ?Pre-anesthesia Checklist: Patient identified, Emergency Drugs available, Suction available and Patient being monitored ?Patient Re-evaluated:Patient Re-evaluated prior to induction ?Oxygen Delivery Method: Circle system utilized ?Preoxygenation: Pre-oxygenation with 100% oxygen ?Induction Type: IV induction ?Ventilation: Mask ventilation without difficulty ?Laryngoscope Size: Mac and 3 ?Grade View: Grade II ?Tube type: Oral ?Tube size: 6.5 mm ?Number of attempts: 1 ?Placement Confirmation: ETT inserted through vocal cords under direct vision, positive ETCO2 and breath sounds checked- equal and bilateral ?Secured at: 22 cm ?Tube secured with: Tape ?Dental Injury: Teeth and Oropharynx as per pre-operative assessment  ? ? ? ? ?

## 2021-07-23 NOTE — ED Notes (Signed)
RN to bedside to introduce self to pt. Pt sitting on the edge of the bed. Pt CAOx4 and in no acute distress.  ?

## 2021-07-23 NOTE — Anesthesia Preprocedure Evaluation (Addendum)
Anesthesia Evaluation  ?Patient identified by MRN, date of birth, ID band ?Patient awake ? ? ? ?Reviewed: ?Allergy & Precautions, H&P , NPO status , Patient's Chart, lab work & pertinent test results ? ?History of Anesthesia Complications ?Negative for: history of anesthetic complications ? ?Airway ?Mallampati: II ? ?TM Distance: >3 FB ?Neck ROM: full ? ? ? Dental ? ?(+) Teeth Intact ?  ?Pulmonary ?neg pulmonary ROS, neg sleep apnea, neg COPD,  ?  ?breath sounds clear to auscultation ? ? ? ? ? ? Cardiovascular ?(-) angina(-) Past MI and (-) Cardiac Stents negative cardio ROS ? ?(-) dysrhythmias  ?Rhythm:regular Rate:Normal ? ? ?  ?Neuro/Psych ?negative neurological ROS ? negative psych ROS  ? GI/Hepatic ?Neg liver ROS, Appendicitis ? ?S/f robotic appendectomy ?  ?Endo/Other  ?obese ? Renal/GU ?  ? ?  ?Musculoskeletal ? ? Abdominal ?  ?Peds ? Hematology ?negative hematology ROS ?(+)   ?Anesthesia Other Findings ?History reviewed. No pertinent past medical history. ? ?Past Surgical History: ?No date: CESAREAN SECTION ? ?BMI   ? Body Mass Index: 33.48 kg/m?  ?  ? ? Reproductive/Obstetrics ?negative OB ROS ? ?  ? ? ? ? ? ? ? ? ? ? ? ? ? ?  ?  ? ? ? ? ? ? ? ?Anesthesia Physical ?Anesthesia Plan ? ?ASA: 2 ? ?Anesthesia Plan: General ETT  ? ?Post-op Pain Management:   ? ?Induction:  ? ?PONV Risk Score and Plan: Ondansetron, Dexamethasone, Midazolam and Treatment may vary due to age or medical condition ? ?Airway Management Planned:  ? ?Additional Equipment:  ? ?Intra-op Plan:  ? ?Post-operative Plan:  ? ?Informed Consent: I have reviewed the patients History and Physical, chart, labs and discussed the procedure including the risks, benefits and alternatives for the proposed anesthesia with the patient or authorized representative who has indicated his/her understanding and acceptance.  ? ? ? ?Dental Advisory Given ? ?Plan Discussed with: Anesthesiologist, CRNA and Surgeon ? ?Anesthesia  Plan Comments:   ? ? ? ? ? ?Anesthesia Quick Evaluation ? ?

## 2021-07-23 NOTE — ED Provider Notes (Signed)
? ?[facility] ?Provider Note ? ? ? Event Date/Time  ? First MD Initiated Contact with Patient 07/23/21 1615   ?  (approximate) ? ? ?History  ? ?Abdominal Pain ? ? ?HPI ? ?Carolyn Moreno is a 51 y.o. female no significant past medical history presents to the ER for evaluation of progressively worsening left-sided abdominal pain associate with some nausea no vomiting no diarrhea no dysuria no hematuria.  Has never had pain like this before.  Denies any history of reflux or gastritis.  No recent antibiotics no recent steroids.  No trauma ?  ? ? ?Physical Exam  ? ?Triage Vital Signs: ?ED Triage Vitals  ?Enc Vitals Group  ?   BP 07/23/21 1525 (!) 164/95  ?   Pulse Rate 07/23/21 1525 95  ?   Resp 07/23/21 1525 18  ?   Temp 07/23/21 1525 99.4 ?F (37.4 ?C)  ?   Temp Source 07/23/21 1525 Oral  ?   SpO2 07/23/21 1525 96 %  ?   Weight 07/23/21 1531 189 lb (85.7 kg)  ?   Height 07/23/21 1531 5\' 3"  (1.6 m)  ?   Head Circumference --   ?   Peak Flow --   ?   Pain Score 07/23/21 1531 9  ?   Pain Loc --   ?   Pain Edu? --   ?   Excl. in GC? --   ? ? ?Most recent vital signs: ?Vitals:  ? 07/23/21 2231 07/23/21 2302  ?BP: 111/71 (!) 113/59  ?Pulse: 78 72  ?Resp: 18 18  ?Temp: 97.9 ?F (36.6 ?C) 99 ?F (37.2 ?C)  ?SpO2: 98% 99%  ? ? ? ?Constitutional: Alert  ?Eyes: Conjunctivae are normal.  ?Head: Atraumatic. ?Nose: No congestion/rhinnorhea. ?Mouth/Throat: Mucous membranes are moist.   ?Neck: Painless ROM.  ?Cardiovascular:   Good peripheral circulation. ?Respiratory: Normal respiratory effort.  No retractions.  ?Gastrointestinal: Soft and nontender.  ?Musculoskeletal:  no deformity ?Neurologic:  MAE spontaneously. No gross focal neurologic deficits are appreciated.  ?Skin:  Skin is warm, dry and intact. No rash noted. ?Psychiatric: Mood and affect are normal. Speech and behavior are normal. ? ? ? ?ED Results / Procedures / Treatments  ? ?Labs ?(all labs ordered are listed, but only abnormal results are  displayed) ?Labs Reviewed  ?COMPREHENSIVE METABOLIC PANEL - Abnormal; Notable for the following components:  ?    Result Value  ? Glucose, Bld 104 (*)   ? All other components within normal limits  ?CBC - Abnormal; Notable for the following components:  ? WBC 10.9 (*)   ? All other components within normal limits  ?URINALYSIS, ROUTINE W REFLEX MICROSCOPIC - Abnormal; Notable for the following components:  ? Color, Urine STRAW (*)   ? APPearance CLEAR (*)   ? All other components within normal limits  ?LIPASE, BLOOD  ?HIV ANTIBODY (ROUTINE TESTING W REFLEX)  ?POC URINE PREG, ED  ?SURGICAL PATHOLOGY  ?TROPONIN I (HIGH SENSITIVITY)  ? ? ? ?EKG ? ?ED ECG REPORT ?I, Willy EddyPatrick Markiyah Gahm, the attending physician, personally viewed and interpreted this ECG. ? ? Date: 07/23/2021 ? EKG Time: 18:05 ? Rate: 85 ? Rhythm: sinus ? Axis: normal ? Intervals: normal intervals ? ST&T Change: no stemi, no depression ? ? ? ?RADIOLOGY ?Please see ED Course for my review and interpretation. ? ?I personally reviewed all radiographic images ordered to evaluate for the above acute complaints and reviewed radiology reports and findings.  These findings were personally discussed with  the patient.  Please see medical record for radiology report. ? ? ? ?PROCEDURES: ? ?Critical Care performed:  ? ?Procedures ? ? ?MEDICATIONS ORDERED IN ED: ?Medications  ?piperacillin-tazobactam (ZOSYN) IVPB 3.375 g (has no administration in time range)  ?enoxaparin (LOVENOX) injection 40 mg (40 mg Subcutaneous Given 07/23/21 2320)  ?acetaminophen (TYLENOL) tablet 650 mg (has no administration in time range)  ?  Or  ?acetaminophen (TYLENOL) suppository 650 mg (has no administration in time range)  ?HYDROcodone-acetaminophen (NORCO/VICODIN) 5-325 MG per tablet 1-2 tablet (has no administration in time range)  ?morphine (PF) 4 MG/ML injection 4 mg (4 mg Intravenous Given 07/23/21 2325)  ?ondansetron (ZOFRAN-ODT) disintegrating tablet 4 mg (has no administration in time  range)  ?  Or  ?ondansetron (ZOFRAN) injection 4 mg (has no administration in time range)  ?0.9 %  sodium chloride infusion ( Intravenous New Bag/Given 07/23/21 2322)  ?oxyCODONE (Oxy IR/ROXICODONE) 5 MG immediate release tablet (  Not Given 07/23/21 2239)  ?ondansetron (ZOFRAN-ODT) disintegrating tablet 4 mg (4 mg Oral Given 07/23/21 1540)  ?alum & mag hydroxide-simeth (MAALOX/MYLANTA) 200-200-20 MG/5ML suspension 30 mL (30 mLs Oral Given 07/23/21 1756)  ?  And  ?lidocaine (XYLOCAINE) 2 % viscous mouth solution 15 mL (15 mLs Oral Given 07/23/21 1756)  ?sodium chloride 0.9 % bolus 500 mL (0 mLs Intravenous Stopped 07/23/21 1845)  ?iohexol (OMNIPAQUE) 300 MG/ML solution 100 mL (100 mLs Intravenous Contrast Given 07/23/21 1838)  ?piperacillin-tazobactam (ZOSYN) IVPB 3.375 g (0 g Intravenous Stopped 07/23/21 2026)  ?oxyCODONE (Oxy IR/ROXICODONE) immediate release tablet 5 mg (5 mg Oral Given by Other 07/23/21 2150)  ?  Or  ?oxyCODONE (ROXICODONE) 5 MG/5ML solution 5 mg ( Oral See Alternative 07/23/21 2150)  ?fentaNYL (SUBLIMAZE) 100 MCG/2ML injection (  Duplicate 07/23/21 2147)  ? ? ? ?IMPRESSION / MDM / ASSESSMENT AND PLAN / ED COURSE  ?I reviewed the triage vital signs and the nursing notes. ?             ?               ? ?Differential diagnosis includes, but is not limited to, reticulitis, colitis, SBO, perforation, mass, stone, pyelonephritis, appendicitis ? ?Patient presenting to the ER for evaluation of abdominal pain as described above.  CT imaging will be ordered does have mild leukocytosis we will give IV fluids.  Possible gastritis given left upper quadrant abdominal pain will trial GI cocktail.  EKG is nonischemic troponin negative. ? ? ?Clinical Course as of 07/23/21 2325  ?Fri Jul 23, 2021  ?1916 CT imaging with evidence of acute appendicitis.  On repeat exam patient does have pain and guarding on the right lower quadrant which was not significantly apparent earlier on exam.  With palpation of the right lower quadrant she says  it hurts in the left upper quadrant. [PR]  ?1919 Discussed case in consultation with Dr. Maia Plan of general surgery who agrees to admit patient for further evaluation management.  Patient updated.  No history of any allergies to antibiotics.  Given dose of IV Zosyn she is receiving IV fluids.  She is stable for admission. [PR]  ?  ?Clinical Course User Index ?[PR] Willy Eddy, MD  ? ? ? ?FINAL CLINICAL IMPRESSION(S) / ED DIAGNOSES  ? ?Final diagnoses:  ?Acute appendicitis, unspecified acute appendicitis type  ? ? ? ?Rx / DC Orders  ? ?ED Discharge Orders   ? ? None  ? ?  ? ? ? ?Note:  This document was prepared using  [employer] and may include unintentional dictation errors. ? ?  ?Willy Eddy, MD ?07/23/21 2325 ? ?

## 2021-07-24 ENCOUNTER — Encounter: Payer: Self-pay | Admitting: General Surgery

## 2021-07-24 LAB — HIV ANTIBODY (ROUTINE TESTING W REFLEX): HIV Screen 4th Generation wRfx: NONREACTIVE

## 2021-07-24 MED ORDER — TRAMADOL HCL 50 MG PO TABS: 50.0000 mg | ORAL_TABLET | Freq: Four times a day (QID) | ORAL | 0 refills | Status: AC | PRN

## 2021-07-24 MED ORDER — SODIUM CHLORIDE 0.9 % IV SOLN: INTRAVENOUS | Status: DC | PRN

## 2021-07-24 NOTE — Discharge Summary (Signed)
?  Patient ID: ?Carolyn Moreno ?MRN: [id number] ?DOB/AGE: 50-Feb-1972 50 y.o. ? ?Admit date: 07/23/2021 ?Discharge date: 07/24/2021 ? ? ?Discharge Diagnoses:  ?Principal Problem: ?  Acute appendicitis with localized peritonitis ? ? ?Procedures: Robotic to the laparoscopic appendectomy ? ?Hospital Course: Patient with acute appendicitis.  She was admitted for surgical management.  She had robotic assisted laparoscopic appendectomy.  She tolerated the procedure well.  She has been recovering adequately.  She is tolerating diet.  Pain controlled.  Patient ambulating. ? ?Physical Exam ?HENT:  ?   Head: Normocephalic.  ?Cardiovascular:  ?   Rate and Rhythm: Normal rate and regular rhythm.  ?   Heart sounds: Normal heart sounds.  ?Pulmonary:  ?   Effort: Pulmonary effort is normal.  ?   Breath sounds: Normal breath sounds.  ?Abdominal:  ?   General: Abdomen is flat. Bowel sounds are normal.  ?   Palpations: Abdomen is soft.  ?   Comments: The wounds are dry and clean  ?Skin: ?   Capillary Refill: Capillary refill takes less than 2 seconds.  ?Neurological:  ?   General: No focal deficit present.  ?   Mental Status: She is alert and oriented to person, place, and time.  ? ? ? ?Consults: None ? ?Disposition: Discharge disposition: 01-Home or Self Care ? ? ? ? ? ? ?Discharge Instructions   ? ? Diet - low sodium heart healthy   Complete by: As directed ?  ? Increase activity slowly   Complete by: As directed ?  ? ?  ? ?Allergies as of 07/24/2021   ?No Known Allergies ?  ? ?  ?Medication List  ?  ? ?TAKE these medications   ? ?traMADol 50 MG tablet ?Commonly known as: Ultram ?Take 1 tablet (50 mg total) by mouth every 6 (six) hours as needed. ?  ? ?  ? ? Follow-up Information   ? ? Carolan Shiver, MD Follow up in 2 week(s).   ?Specialty: General Surgery ?Why: Follow up after cholecstectomy ?Contact information: ?[address] ?[place] [address] ?[phone] ? ? ?  ?  ? ?  ?  ? ?  ? ? ? ?

## 2021-07-24 NOTE — Anesthesia Postprocedure Evaluation (Signed)
Anesthesia Post Note ? ?Patient: Carolyn Moreno ? ?Procedure(s) Performed: XI ROBOTIC LAPAROSCOPIC ASSISTED APPENDECTOMY ? ?Patient location during evaluation: PACU ?Anesthesia Type: General ?Level of consciousness: awake and alert ?Pain management: pain level controlled ?Vital Signs Assessment: post-procedure vital signs reviewed and stable ?Respiratory status: spontaneous breathing, nonlabored ventilation, respiratory function stable and patient connected to nasal cannula oxygen ?Cardiovascular status: blood pressure returned to baseline and stable ?Postop Assessment: no apparent nausea or vomiting ?Anesthetic complications: no ? ? ?No notable events documented. ? ? ?Last Vitals:  ?Vitals:  ? 07/23/21 2350 07/24/21 0003  ?BP: (!) 116/52 114/65  ?Pulse: (!) 113 73  ?Resp:  18  ?Temp:  37.1 ?C  ?SpO2: 97% 96%  ?  ?Last Pain:  ?Vitals:  ? 07/24/21 0003  ?TempSrc: Oral  ?PainSc:   ? ? ?  ?  ?  ?  ?  ?  ? ?Tera Mater ? ? ? ? ?

## 2021-07-24 NOTE — Discharge Instructions (Signed)

## 2021-07-24 NOTE — Progress Notes (Signed)
Carolyn Moreno to be D/C'd Home per MD order.  Discussed prescriptions and follow up appointments with the patient. Prescriptions given to patient, medication list explained in detail. Pt verbalized understanding. ? ?Allergies as of 07/24/2021   ?No Known Allergies ?  ? ?  ?Medication List  ?  ? ?TAKE these medications   ? ?traMADol 50 MG tablet ?Commonly known as: Ultram ?Take 1 tablet (50 mg total) by mouth every 6 (six) hours as needed. ?  ? ?  ? ? ?Vitals:  ? 07/24/21 0438 07/24/21 0813  ?BP: 105/64 107/63  ?Pulse: 72 62  ?Resp: 18 16  ?Temp: 98.5 ?F (36.9 ?C) 97.6 ?F (36.4 ?C)  ?SpO2: 98% 97%  ? ? ?Skin clean, dry and intact without evidence of skin break down, no evidence of skin tears noted. IV catheter discontinued intact. Site without signs and symptoms of complications. Dressing and pressure applied. Pt denies pain at this time. No complaints noted. ? ?An After Visit Summary was printed and given to the patient. ?Patient escorted via [place], and D/C home via private auto. ? ?[place]. [place]  ?

## 2021-07-27 LAB — SURGICAL PATHOLOGY

## 2022-07-12 IMAGING — US US EXTREM LOW VENOUS*L*
1 series · 14 of 24 positions shown · non-contrast
Comparison: None.

CLINICAL DATA: Left leg pain for 3 months

EXAM:
LEFT LOWER EXTREMITY VENOUS DOPPLER ULTRASOUND
TECHNIQUE: Gray-scale sonography with compression, as well as color and duplex
ultrasound, were performed to evaluate the deep venous system(s)
from the level of the common femoral vein through the popliteal and
proximal calf veins.

[Series 1: us venous img lower uni left (dvt) · portal-venous · 14 of 33 slices shown]
[im 1/33]
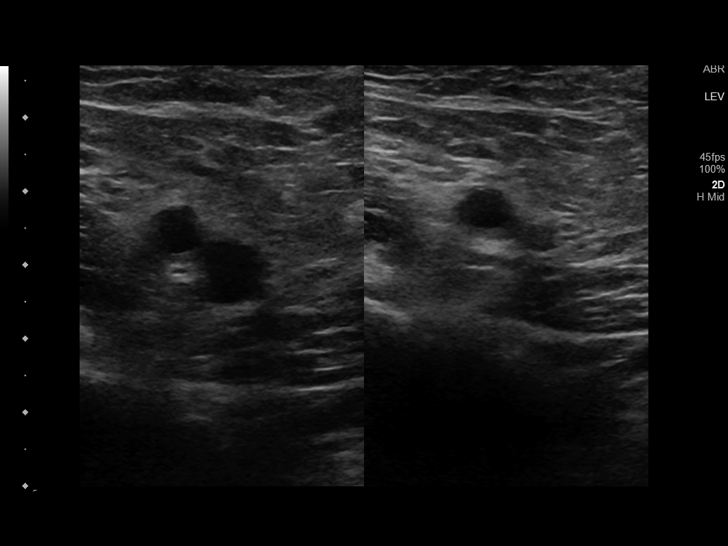
[im 3/33]
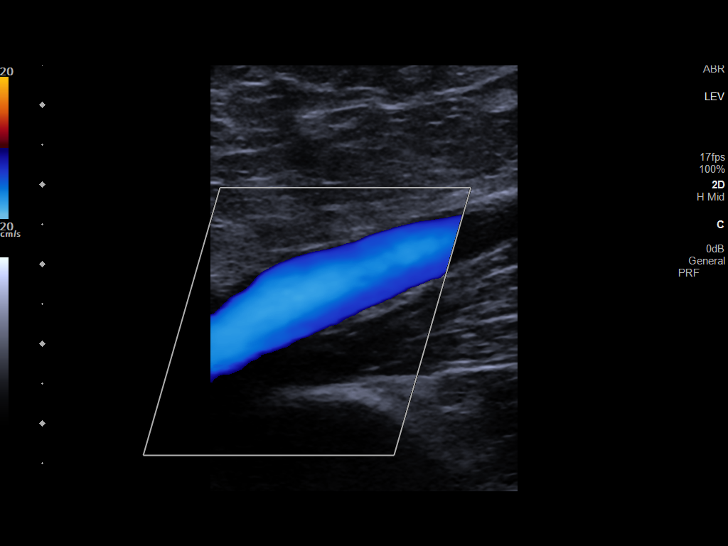
[im 6/33]
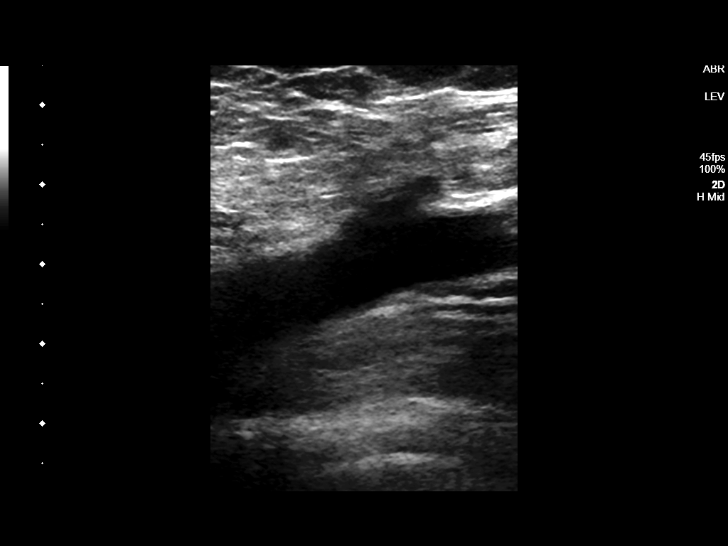
[im 9/33]
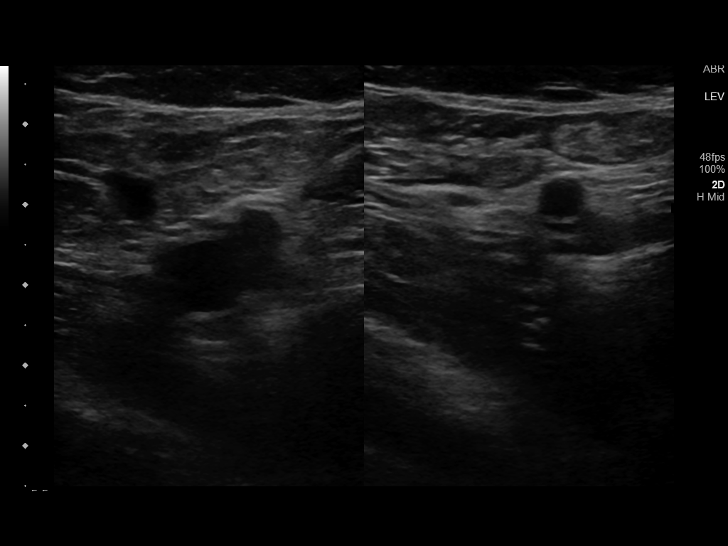
[im 10/33]
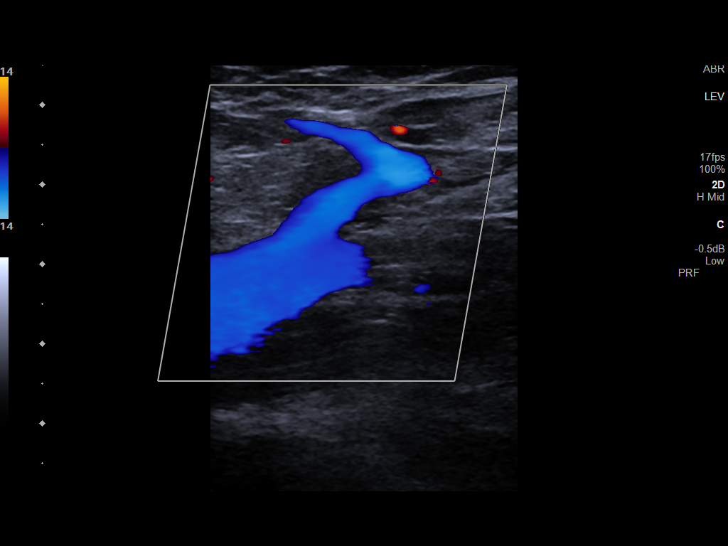
[im 13/33]
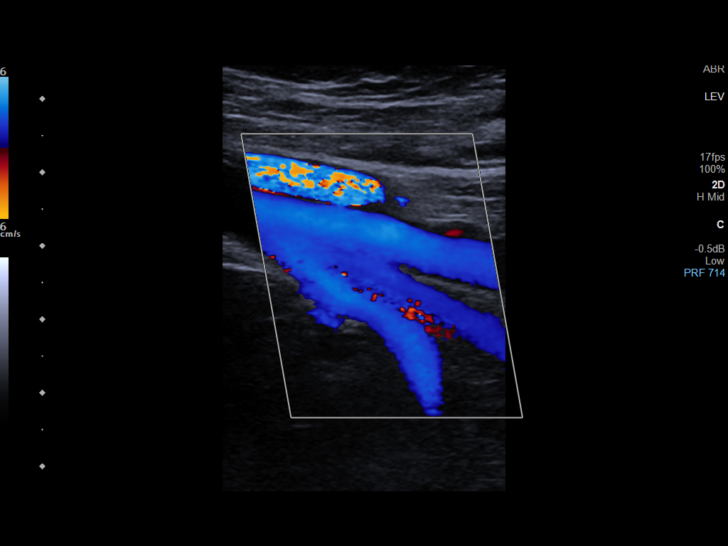
[im 16/33]
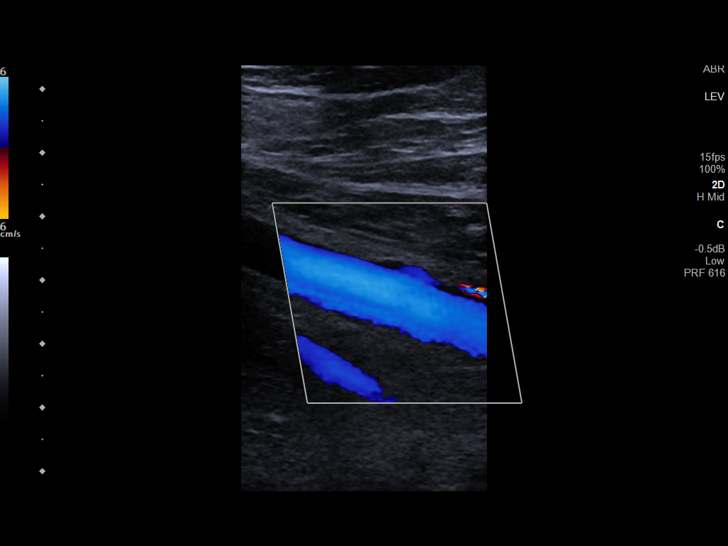
[im 17/33]
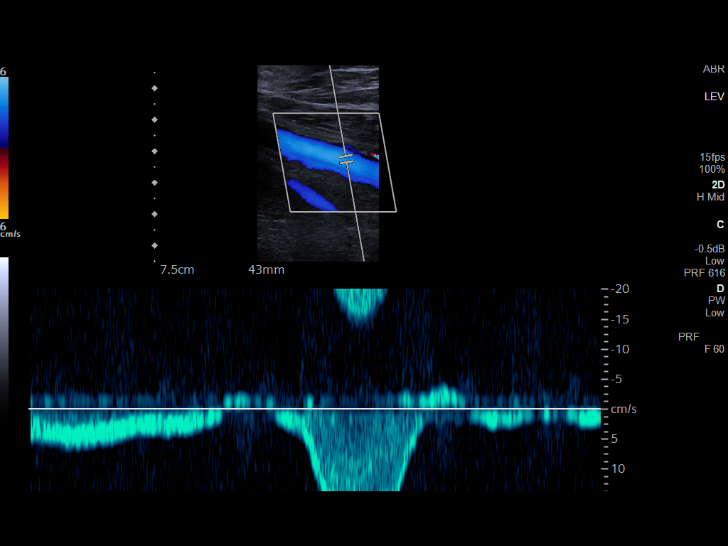
[im 20/33]
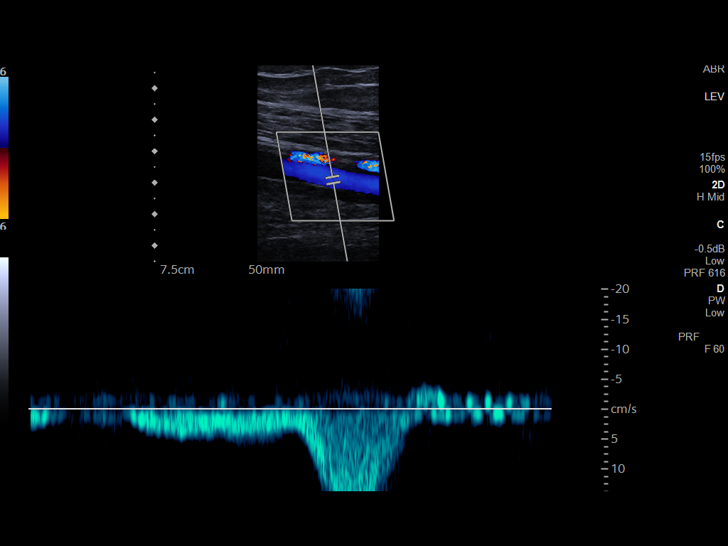
[im 23/33]
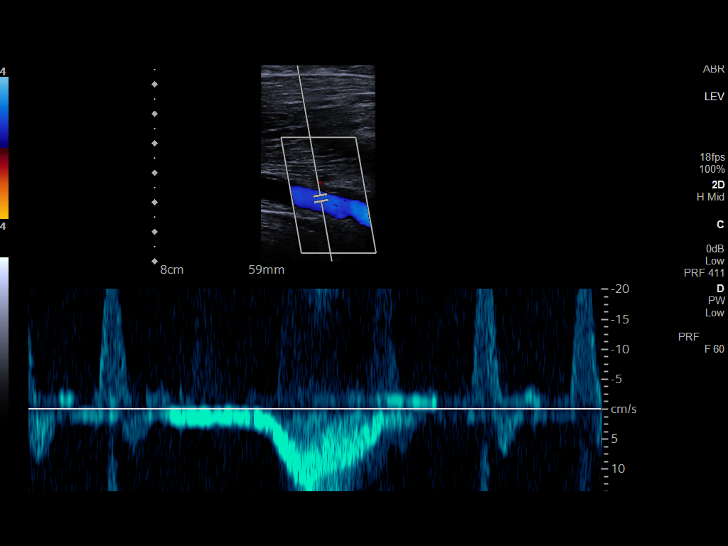
[im 26/33]
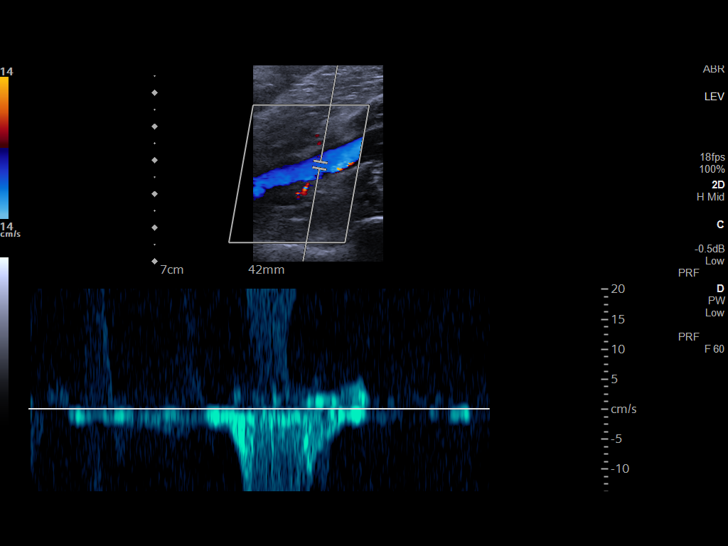
[im 27/33]
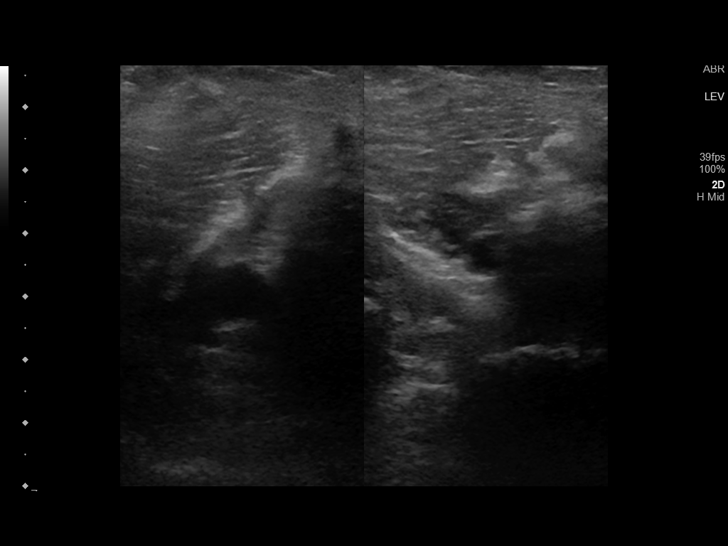
[im 30/33]
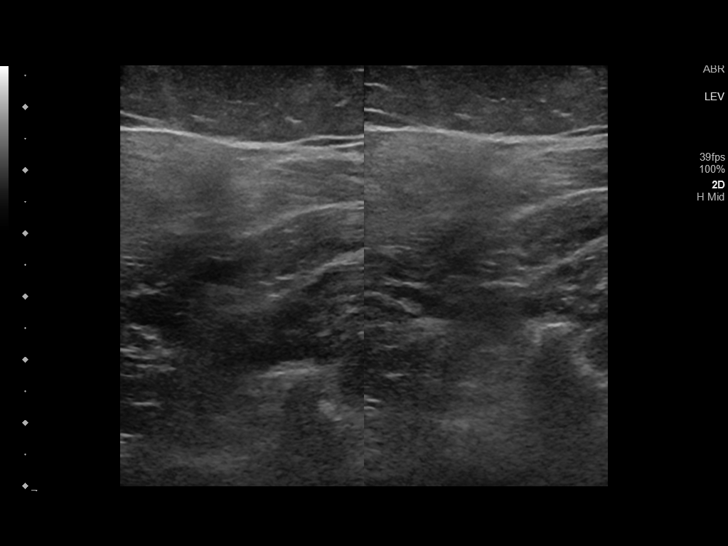
[im 33/33]
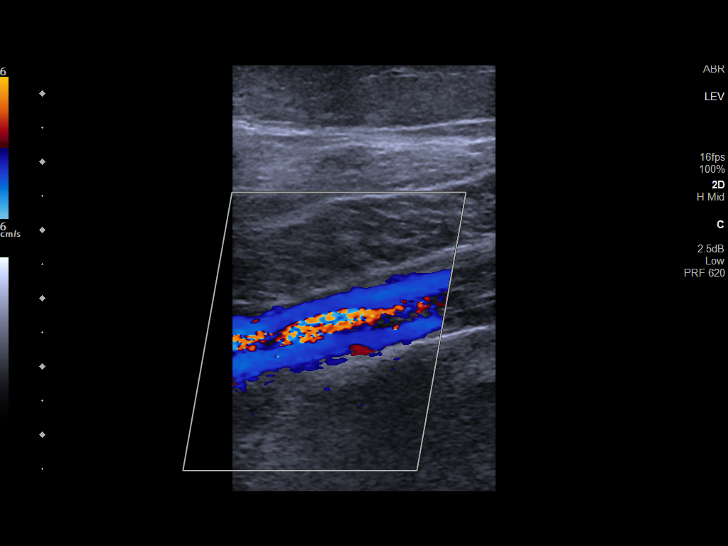

[14 of 24 positions shown; findings below may reference images not displayed]

FINDINGS: VENOUS

Normal compressibility of the common femoral, superficial femoral,
and popliteal veins, as well as the visualized calf veins.
Visualized portions of profunda femoral vein and great saphenous
vein unremarkable. No filling defects to suggest DVT on grayscale or
color Doppler imaging. Doppler waveforms show normal direction of
venous flow, normal respiratory plasticity and response to
augmentation.

Limited views of the contralateral common femoral vein are
unremarkable.

OTHER

None.

Limitations: none
IMPRESSION: Negative.

## 2023-06-04 IMAGING — CT CT ABD-PELV W/ CM
2 of 5 series · 16 of 46 positions shown, 18 images · IV contrast (APPLIED)
Comparison: None.

CLINICAL DATA: Abdominal pain, acute, nonlocalized. Left upper
quadrant pain for 2 days with nausea.

EXAM:
CT ABDOMEN AND PELVIS WITH CONTRAST
TECHNIQUE: Multidetector CT imaging of the abdomen and pelvis was performed
using the standard protocol following bolus administration of
intravenous contrast.

[Series 2: abdomen 5.0 · axial · 0.87mm/px · z∈[-1016,-636]mm · 13 of 88 slices shown, 15 images]
[im 6/88  soft-tissue]
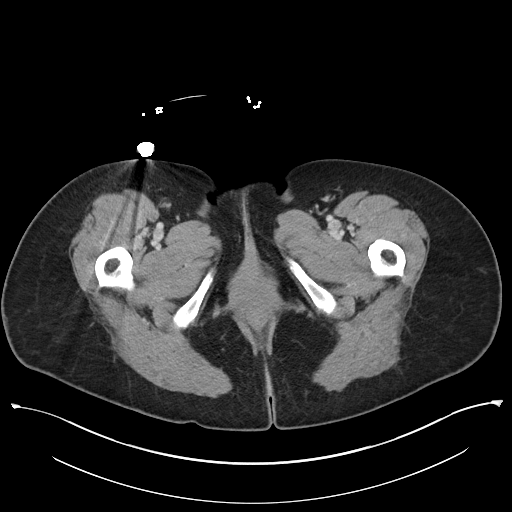
[im 6/88  bone]
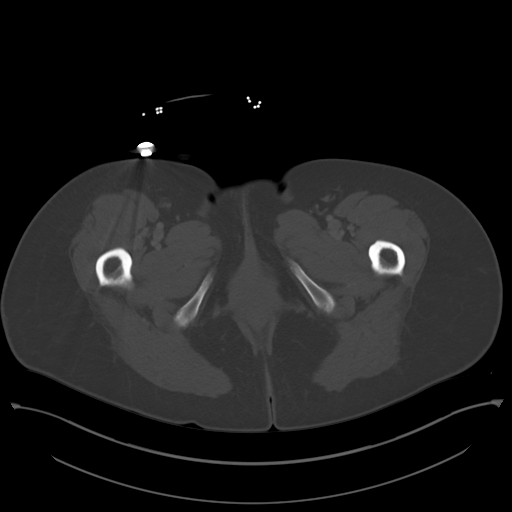
[im 11/88  soft-tissue]
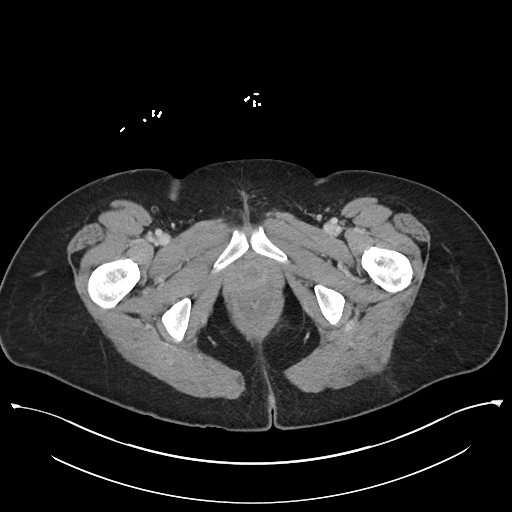
[im 17/88  soft-tissue]
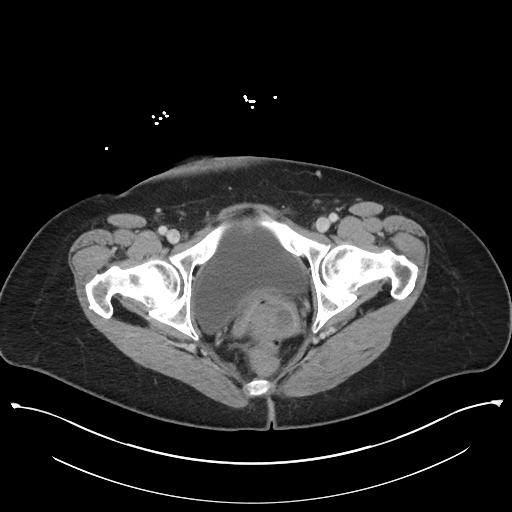
[im 28/88  soft-tissue]
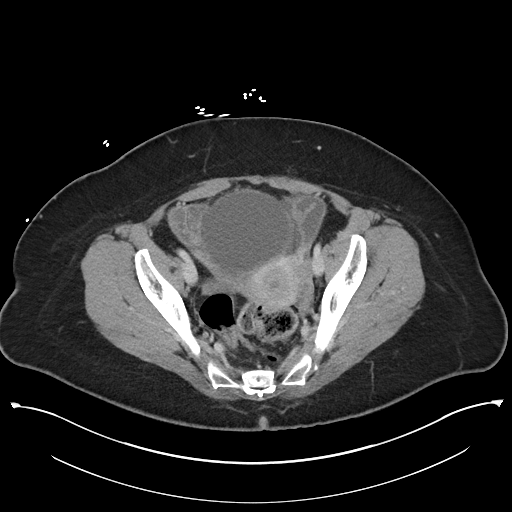
[im 33/88  soft-tissue]
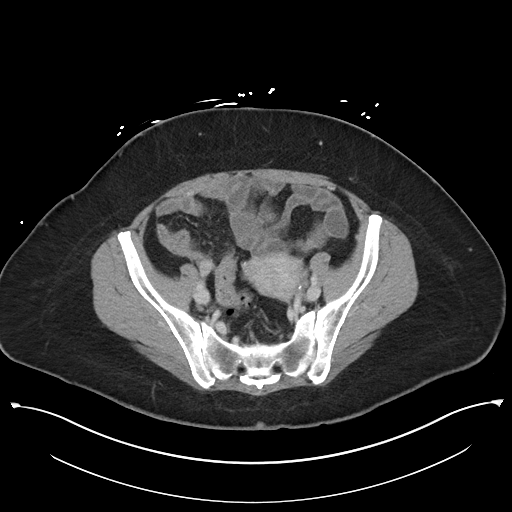
[im 39/88  soft-tissue]
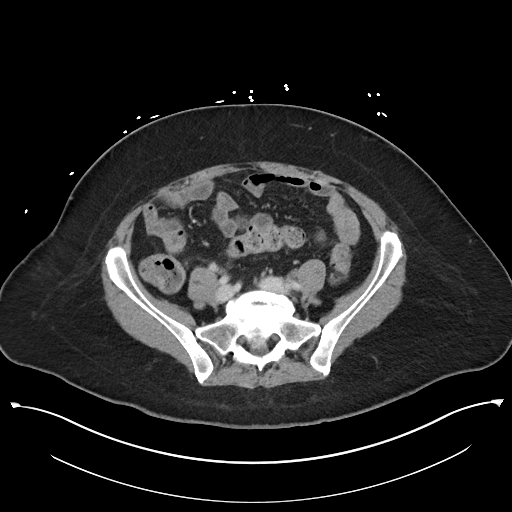
[im 44/88  soft-tissue]
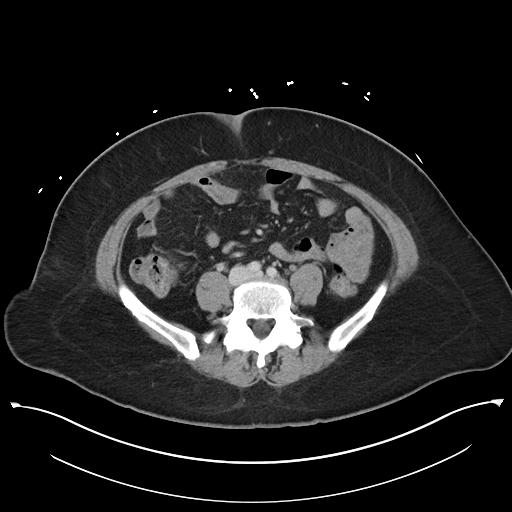
[im 49/88  soft-tissue]
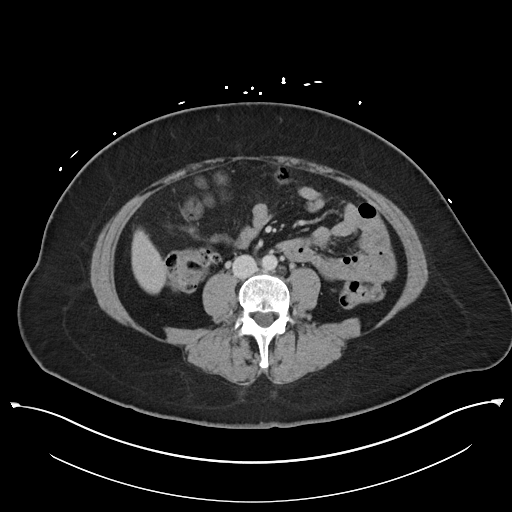
[im 55/88  soft-tissue]
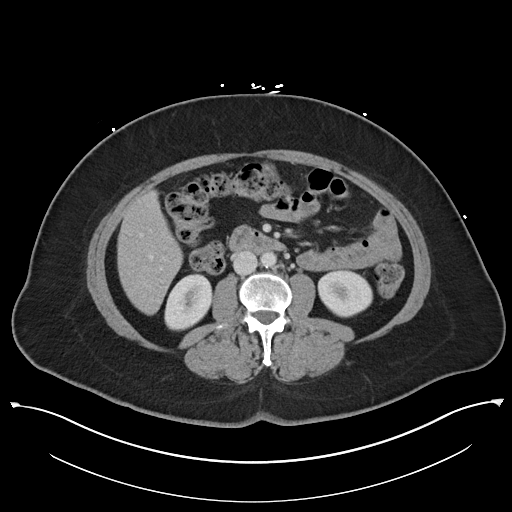
[im 55/88  bone]
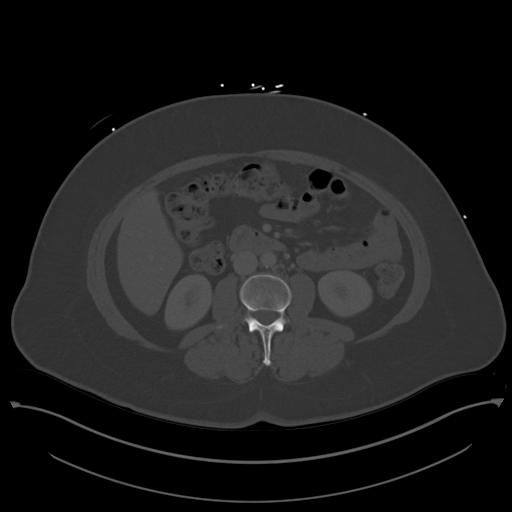
[im 60/88  soft-tissue]
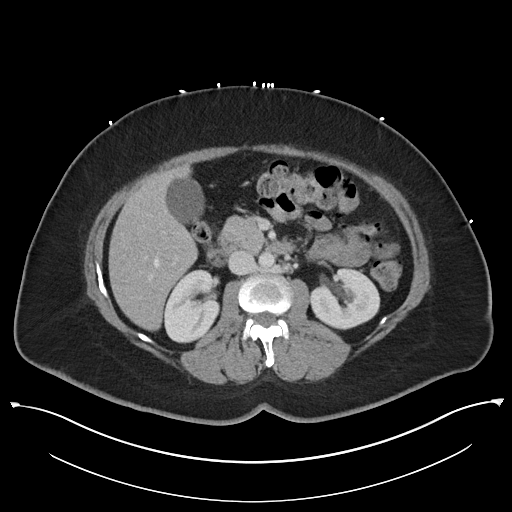
[im 71/88  soft-tissue]
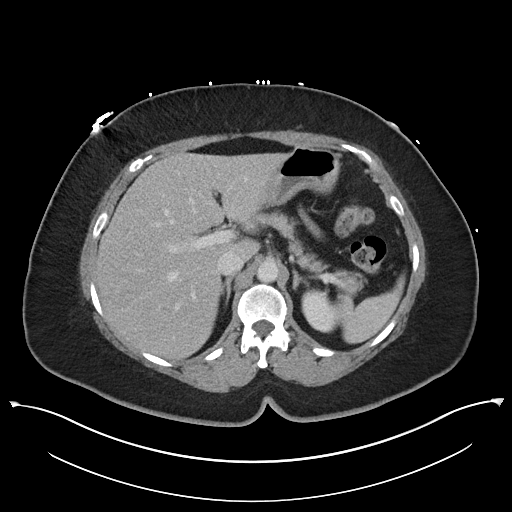
[im 77/88  soft-tissue]
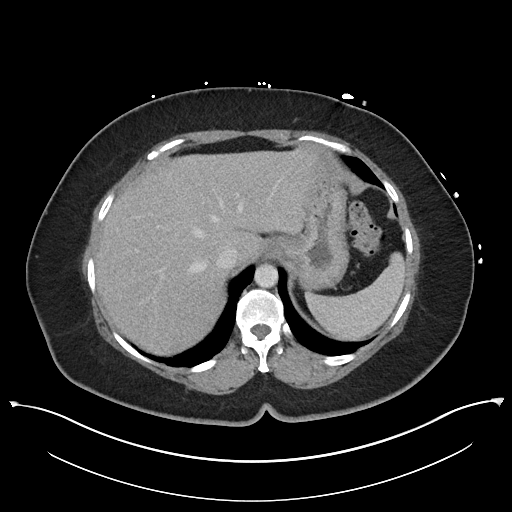
[im 82/88  soft-tissue]
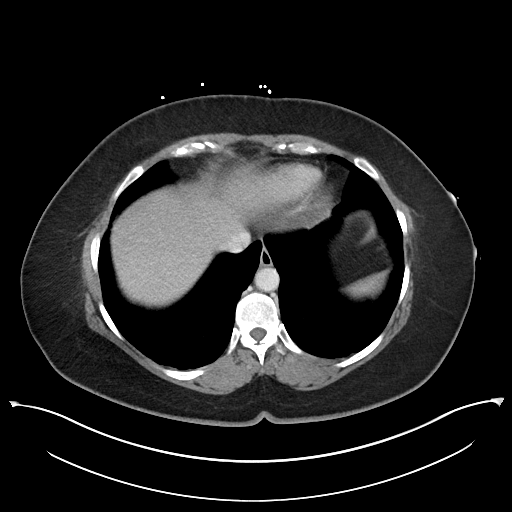

[Series 5: abdomen 3.0 mpr cor · coronal · 0.87mm/px · 3 of 102 slices shown]
[im 34/102  soft-tissue]
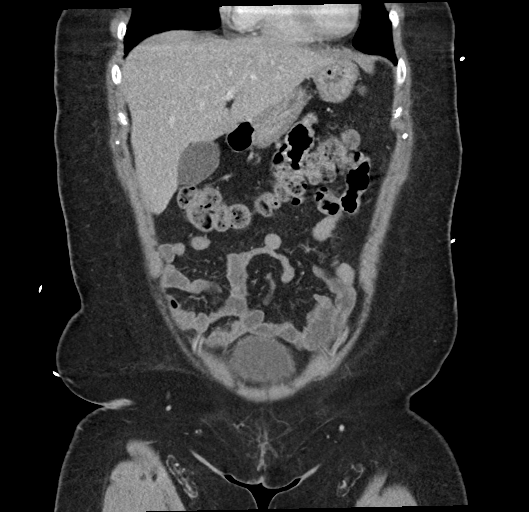
[im 45/102  soft-tissue]
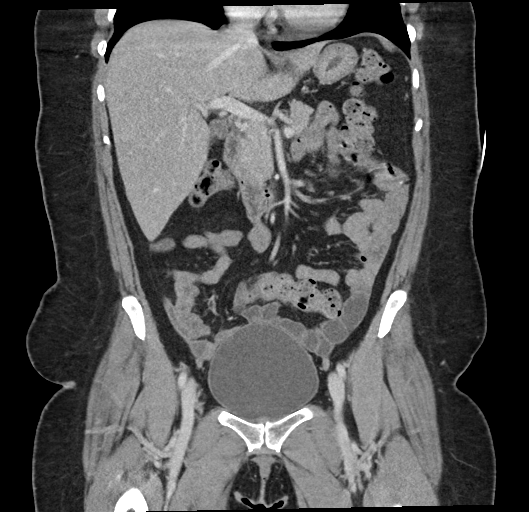
[im 57/102  soft-tissue]
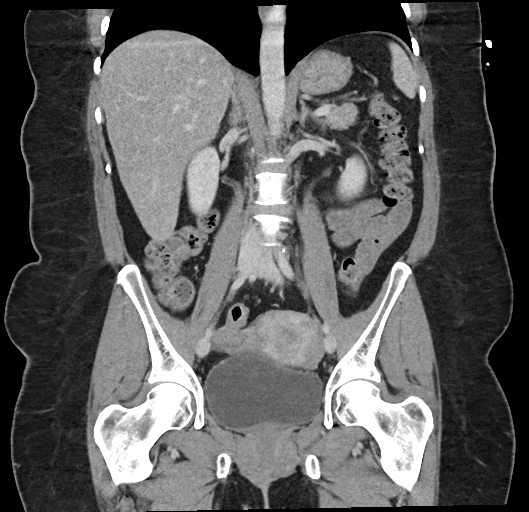

[16 of 46 positions shown; findings below may reference images not displayed]

RADIATION DOSE REDUCTION: This exam was performed according to the
departmental dose-optimization program which includes automated
exposure control, adjustment of the mA and/or kV according to
patient size and/or use of iterative reconstruction technique.

CONTRAST:  100mL OMNIPAQUE IOHEXOL 300 MG/ML  SOLN
FINDINGS: Lower chest: The lung bases are clear without focal nodule, mass, or
airspace disease. Heart size is normal. No significant pleural or
pericardial effusion is present.

Hepatobiliary: No focal liver abnormality is seen. No gallstones,
gallbladder wall thickening, or biliary dilatation.

Pancreas: Unremarkable. No pancreatic ductal dilatation or
surrounding inflammatory changes.

Spleen: Normal in size without focal abnormality.

Adrenals/Urinary Tract: Adrenal glands are within normal limits
bilaterally. Kidneys and ureters are unremarkable. Urinary bladder
is within normal limits.

Stomach/Bowel: Stomach and duodenum are within normal limits. Small
bowel is unremarkable. Terminal ileum is within normal limits.
Appendicolith no is present. The appendix is dilated up to 8 mm a
mild stranding. The ascending and transverse colon are normal.
Transverse and descending colon are unremarkable.

Vascular/Lymphatic: No significant vascular findings are present. No
enlarged abdominal or pelvic lymph nodes.

Reproductive: Nabothian cysts are present. Mild edema is present in
the uterus. Adnexa are unremarkable.

Other: No abdominal wall hernia or abnormality. No abdominopelvic
ascites.

Musculoskeletal: Vacuum disc present at L5-S1 with central and
foraminal narrowing. Vertebral body heights are maintained. No focal
osseous lesions are present. Bony pelvis is within normal limits.
Hips are unremarkable.
IMPRESSION: 1. Acute appendicitis without evidence for rupture or abscess.
2. Mild edema in the uterus is likely physiologic.
3. Vacuum disc at L5-S1 with central and foraminal narrowing.
